# Patient Record
Sex: Male | Born: 1955 | Race: White | Hispanic: No | Marital: Married | Smoking: Current every day smoker
Health system: Southern US, Community
[De-identification: ages and names within clinical notes are randomized; demographics above are authoritative.]

## PROBLEM LIST (undated history)

## (undated) DIAGNOSIS — I739 Peripheral vascular disease, unspecified: Secondary | ICD-10-CM

## (undated) DIAGNOSIS — I2510 Atherosclerotic heart disease of native coronary artery without angina pectoris: Secondary | ICD-10-CM

## (undated) DIAGNOSIS — I219 Acute myocardial infarction, unspecified: Secondary | ICD-10-CM

## (undated) HISTORY — PX: ROTATOR CUFF REPAIR: SHX139

## (undated) HISTORY — DX: Acute myocardial infarction, unspecified: I21.9

## (undated) HISTORY — DX: Peripheral vascular disease, unspecified: I73.9

## (undated) HISTORY — PX: KNEE SURGERY: SHX244

## (undated) HISTORY — DX: Atherosclerotic heart disease of native coronary artery without angina pectoris: I25.10

---

## 1997-11-23 ENCOUNTER — Observation Stay (HOSPITAL_COMMUNITY): Admission: AD | Admit: 1997-11-23 | Discharge: 1997-11-24 | Payer: Self-pay | Admitting: Cardiovascular Disease

## 1998-02-21 ENCOUNTER — Encounter: Payer: Self-pay | Admitting: Emergency Medicine

## 1998-02-21 ENCOUNTER — Inpatient Hospital Stay (HOSPITAL_COMMUNITY): Admission: EM | Admit: 1998-02-21 | Discharge: 1998-02-23 | Payer: Self-pay | Admitting: Emergency Medicine

## 1998-06-01 ENCOUNTER — Inpatient Hospital Stay (HOSPITAL_COMMUNITY): Admission: EM | Admit: 1998-06-01 | Discharge: 1998-06-02 | Payer: Self-pay | Admitting: Emergency Medicine

## 1998-06-01 ENCOUNTER — Encounter: Payer: Self-pay | Admitting: Emergency Medicine

## 1998-06-17 HISTORY — PX: CORONARY ARTERY BYPASS GRAFT: SHX141

## 1998-07-31 ENCOUNTER — Inpatient Hospital Stay (HOSPITAL_COMMUNITY): Admission: EM | Admit: 1998-07-31 | Discharge: 1998-08-02 | Payer: Self-pay | Admitting: Emergency Medicine

## 1998-07-31 ENCOUNTER — Encounter: Payer: Self-pay | Admitting: Emergency Medicine

## 1998-10-09 ENCOUNTER — Encounter: Payer: Self-pay | Admitting: *Deleted

## 1998-10-09 ENCOUNTER — Inpatient Hospital Stay (HOSPITAL_COMMUNITY): Admission: EM | Admit: 1998-10-09 | Discharge: 1998-10-13 | Payer: Self-pay | Admitting: Emergency Medicine

## 1998-10-12 ENCOUNTER — Encounter: Payer: Self-pay | Admitting: Cardiovascular Disease

## 1998-10-28 ENCOUNTER — Encounter: Payer: Self-pay | Admitting: Emergency Medicine

## 1998-10-28 ENCOUNTER — Inpatient Hospital Stay (HOSPITAL_COMMUNITY): Admission: EM | Admit: 1998-10-28 | Discharge: 1998-10-31 | Payer: Self-pay | Admitting: *Deleted

## 1998-11-28 ENCOUNTER — Encounter: Payer: Self-pay | Admitting: Emergency Medicine

## 1998-11-29 ENCOUNTER — Inpatient Hospital Stay (HOSPITAL_COMMUNITY): Admission: EM | Admit: 1998-11-29 | Discharge: 1998-11-30 | Payer: Self-pay | Admitting: Emergency Medicine

## 1998-11-29 ENCOUNTER — Encounter: Payer: Self-pay | Admitting: Cardiovascular Disease

## 1998-12-19 ENCOUNTER — Encounter: Payer: Self-pay | Admitting: Emergency Medicine

## 1998-12-19 ENCOUNTER — Inpatient Hospital Stay (HOSPITAL_COMMUNITY): Admission: EM | Admit: 1998-12-19 | Discharge: 1998-12-30 | Payer: Self-pay | Admitting: Emergency Medicine

## 1998-12-20 ENCOUNTER — Encounter: Payer: Self-pay | Admitting: Cardiovascular Disease

## 1998-12-22 ENCOUNTER — Encounter: Payer: Self-pay | Admitting: Surgery

## 1998-12-25 ENCOUNTER — Encounter: Payer: Self-pay | Admitting: Surgery

## 1998-12-26 ENCOUNTER — Encounter: Payer: Self-pay | Admitting: Surgery

## 1998-12-27 ENCOUNTER — Encounter: Payer: Self-pay | Admitting: Surgery

## 2000-01-21 ENCOUNTER — Inpatient Hospital Stay (HOSPITAL_COMMUNITY): Admission: EM | Admit: 2000-01-21 | Discharge: 2000-01-22 | Payer: Self-pay | Admitting: Emergency Medicine

## 2000-01-21 ENCOUNTER — Encounter: Payer: Self-pay | Admitting: Emergency Medicine

## 2000-01-21 ENCOUNTER — Encounter: Payer: Self-pay | Admitting: *Deleted

## 2002-01-14 ENCOUNTER — Inpatient Hospital Stay (HOSPITAL_COMMUNITY): Admission: EM | Admit: 2002-01-14 | Discharge: 2002-01-15 | Payer: Self-pay | Admitting: Emergency Medicine

## 2002-01-14 ENCOUNTER — Encounter: Payer: Self-pay | Admitting: Emergency Medicine

## 2003-06-28 ENCOUNTER — Inpatient Hospital Stay (HOSPITAL_COMMUNITY): Admission: EM | Admit: 2003-06-28 | Discharge: 2003-06-30 | Payer: Self-pay | Admitting: Emergency Medicine

## 2004-12-26 ENCOUNTER — Ambulatory Visit: Payer: Self-pay | Admitting: Internal Medicine

## 2004-12-26 ENCOUNTER — Inpatient Hospital Stay (HOSPITAL_COMMUNITY): Admission: EM | Admit: 2004-12-26 | Discharge: 2004-12-27 | Payer: Self-pay | Admitting: Emergency Medicine

## 2005-01-01 ENCOUNTER — Encounter: Admission: RE | Admit: 2005-01-01 | Discharge: 2005-01-01 | Payer: Self-pay | Admitting: Occupational Medicine

## 2008-07-08 ENCOUNTER — Encounter (INDEPENDENT_AMBULATORY_CARE_PROVIDER_SITE_OTHER): Payer: Self-pay | Admitting: Internal Medicine

## 2008-07-08 ENCOUNTER — Observation Stay (HOSPITAL_COMMUNITY): Admission: EM | Admit: 2008-07-08 | Discharge: 2008-07-09 | Payer: Self-pay | Admitting: Emergency Medicine

## 2008-07-08 ENCOUNTER — Ambulatory Visit: Payer: Self-pay | Admitting: Vascular Surgery

## 2010-01-05 ENCOUNTER — Ambulatory Visit: Payer: Self-pay | Admitting: Vascular Surgery

## 2010-01-23 ENCOUNTER — Ambulatory Visit (HOSPITAL_COMMUNITY): Admission: RE | Admit: 2010-01-23 | Discharge: 2010-01-23 | Payer: Self-pay | Admitting: Surgery

## 2010-01-23 ENCOUNTER — Ambulatory Visit: Payer: Self-pay | Admitting: Vascular Surgery

## 2010-02-06 ENCOUNTER — Ambulatory Visit: Payer: Self-pay | Admitting: Vascular Surgery

## 2010-02-16 ENCOUNTER — Inpatient Hospital Stay (HOSPITAL_COMMUNITY): Admission: RE | Admit: 2010-02-16 | Discharge: 2010-02-22 | Payer: Self-pay | Admitting: Vascular Surgery

## 2010-02-16 ENCOUNTER — Ambulatory Visit: Payer: Self-pay | Admitting: Vascular Surgery

## 2010-03-20 ENCOUNTER — Ambulatory Visit: Payer: Self-pay | Admitting: Vascular Surgery

## 2010-04-10 ENCOUNTER — Ambulatory Visit: Payer: Self-pay | Admitting: Vascular Surgery

## 2010-07-07 ENCOUNTER — Encounter: Payer: Self-pay | Admitting: Vascular Surgery

## 2010-07-08 ENCOUNTER — Encounter: Payer: Self-pay | Admitting: Surgery

## 2010-07-08 ENCOUNTER — Encounter: Payer: Self-pay | Admitting: Internal Medicine

## 2010-08-30 LAB — SURGICAL PCR SCREEN
MRSA, PCR: NEGATIVE
Staphylococcus aureus: NEGATIVE

## 2010-08-30 LAB — COMPREHENSIVE METABOLIC PANEL WITH GFR
ALT: 16 U/L (ref 0–53)
AST: 25 U/L (ref 0–37)
Albumin: 2.7 g/dL — ABNORMAL LOW (ref 3.5–5.2)
Alkaline Phosphatase: 52 U/L (ref 39–117)
BUN: 6 mg/dL (ref 6–23)
CO2: 27 meq/L (ref 19–32)
Calcium: 7.6 mg/dL — ABNORMAL LOW (ref 8.4–10.5)
Chloride: 104 meq/L (ref 96–112)
Creatinine, Ser: 0.89 mg/dL (ref 0.4–1.5)
GFR calc non Af Amer: >60 mL/min
Glucose, Bld: 121 mg/dL — ABNORMAL HIGH (ref 70–99)
Potassium: 3.9 meq/L (ref 3.5–5.1)
Sodium: 135 meq/L (ref 135–145)
Total Bilirubin: 0.7 mg/dL (ref 0.3–1.2)
Total Protein: 5.7 g/dL — ABNORMAL LOW (ref 6.0–8.3)

## 2010-08-30 LAB — URINALYSIS, ROUTINE W REFLEX MICROSCOPIC
Hgb urine dipstick: NEGATIVE
Nitrite: NEGATIVE
Protein, ur: NEGATIVE mg/dL
Specific Gravity, Urine: 1.012 (ref 1.005–1.030)
Urobilinogen, UA: 0.2 mg/dL (ref 0.0–1.0)

## 2010-08-30 LAB — CBC
HCT: 38.2 % — ABNORMAL LOW (ref 39.0–52.0)
Hemoglobin: 11.7 g/dL — ABNORMAL LOW (ref 13.0–17.0)
Hemoglobin: 12.2 g/dL — ABNORMAL LOW (ref 13.0–17.0)
Hemoglobin: 12.7 g/dL — ABNORMAL LOW (ref 13.0–17.0)
Hemoglobin: 14.3 g/dL (ref 13.0–17.0)
MCH: 30.2 pg (ref 26.0–34.0)
MCH: 30.4 pg (ref 26.0–34.0)
MCH: 30.4 pg (ref 26.0–34.0)
MCH: 30.6 pg (ref 26.0–34.0)
MCHC: 33.2 g/dL (ref 30.0–36.0)
MCHC: 33.5 g/dL (ref 30.0–36.0)
MCHC: 33.6 g/dL (ref 30.0–36.0)
MCHC: 34.7 g/dL (ref 30.0–36.0)
MCV: 90.5 fL (ref 78.0–100.0)
MCV: 91 fL (ref 78.0–100.0)
Platelets: 172 10*3/uL (ref 150–400)
Platelets: 190 K/uL (ref 150–400)
Platelets: 244 10*3/uL (ref 150–400)
RBC: 3.85 MIL/uL — ABNORMAL LOW (ref 4.22–5.81)
RBC: 4.02 MIL/uL — ABNORMAL LOW (ref 4.22–5.81)
RBC: 4.2 MIL/uL — ABNORMAL LOW (ref 4.22–5.81)
RBC: 4.67 MIL/uL (ref 4.22–5.81)
RDW: 13.7 % (ref 11.5–15.5)
RDW: 13.9 % (ref 11.5–15.5)
WBC: 16.6 K/uL — ABNORMAL HIGH (ref 4.0–10.5)

## 2010-08-30 LAB — BLOOD GAS, ARTERIAL
Bicarbonate: 23.6 mEq/L (ref 20.0–24.0)
Drawn by: 333511
FIO2: 0.21 %
O2 Saturation: 91 %
pCO2 arterial: 36.7 mmHg (ref 35.0–45.0)
pH, Arterial: 7.423 (ref 7.350–7.450)
pO2, Arterial: 56.9 mmHg — ABNORMAL LOW (ref 80.0–100.0)

## 2010-08-30 LAB — BASIC METABOLIC PANEL
CO2: 26 mEq/L (ref 19–32)
Calcium: 8.2 mg/dL — ABNORMAL LOW (ref 8.4–10.5)
Creatinine, Ser: 0.73 mg/dL (ref 0.4–1.5)
Creatinine, Ser: 0.78 mg/dL (ref 0.4–1.5)
GFR calc Af Amer: >60 mL/min (ref >60)
GFR calc Af Amer: >60 mL/min (ref >60)
GFR calc non Af Amer: >60 mL/min (ref >60)
Potassium: 3.7 mEq/L (ref 3.5–5.1)
Sodium: 136 mEq/L (ref 135–145)

## 2010-08-30 LAB — TYPE AND SCREEN: ABO/RH(D): A POS

## 2010-08-30 LAB — COMPREHENSIVE METABOLIC PANEL
ALT: 22 U/L (ref 0–53)
AST: 25 U/L (ref 0–37)
Albumin: 3.8 g/dL (ref 3.5–5.2)
Calcium: 9.1 mg/dL (ref 8.4–10.5)
Creatinine, Ser: 0.78 mg/dL (ref 0.4–1.5)
GFR calc Af Amer: >60 mL/min (ref >60)
Sodium: 137 mEq/L (ref 135–145)

## 2010-08-30 LAB — GLUCOSE, CAPILLARY
Glucose-Capillary: 106 mg/dL — ABNORMAL HIGH (ref 70–99)
Glucose-Capillary: 118 mg/dL — ABNORMAL HIGH (ref 70–99)

## 2010-08-30 LAB — PROTIME-INR
INR: 1.15 (ref 0.00–1.49)
Prothrombin Time: 14.9 s (ref 11.6–15.2)

## 2010-08-30 LAB — BASIC METABOLIC PANEL WITH GFR
BUN: 10 mg/dL (ref 6–23)
CO2: 23 meq/L (ref 19–32)
Calcium: 7.9 mg/dL — ABNORMAL LOW (ref 8.4–10.5)
Chloride: 110 meq/L (ref 96–112)
Creatinine, Ser: 0.89 mg/dL (ref 0.4–1.5)
GFR calc non Af Amer: >60 mL/min
Glucose, Bld: 103 mg/dL — ABNORMAL HIGH (ref 70–99)
Potassium: 4.2 meq/L (ref 3.5–5.1)
Sodium: 138 meq/L (ref 135–145)

## 2010-08-30 LAB — POCT I-STAT, CHEM 8
BUN: 11 mg/dL (ref 6–23)
Calcium, Ion: 1.12 mmol/L (ref 1.12–1.32)
Chloride: 110 meq/L (ref 96–112)
Creatinine, Ser: 0.8 mg/dL (ref 0.4–1.5)
Glucose, Bld: 100 mg/dL — ABNORMAL HIGH (ref 70–99)
HCT: 37 % — ABNORMAL LOW (ref 39.0–52.0)
Hemoglobin: 12.6 g/dL — ABNORMAL LOW (ref 13.0–17.0)
Potassium: 4.5 meq/L (ref 3.5–5.1)
Sodium: 141 meq/L (ref 135–145)
TCO2: 24 mmol/L (ref 0–100)

## 2010-08-30 LAB — APTT
aPTT: 32 s (ref 24–37)
aPTT: 29 seconds (ref 24–37)

## 2010-08-30 LAB — MAGNESIUM: Magnesium: 1.9 mg/dL (ref 1.5–2.5)

## 2010-08-30 LAB — AMYLASE: Amylase: 143 U/L — ABNORMAL HIGH (ref 0–105)

## 2010-08-31 LAB — POCT I-STAT, CHEM 8
Calcium, Ion: 1.09 mmol/L — ABNORMAL LOW (ref 1.12–1.32)
Chloride: 112 mEq/L (ref 96–112)
Creatinine, Ser: 0.9 mg/dL (ref 0.4–1.5)
Glucose, Bld: 93 mg/dL (ref 70–99)
Hemoglobin: 13.9 g/dL (ref 13.0–17.0)
Potassium: 3.9 mEq/L (ref 3.5–5.1)

## 2010-10-01 LAB — BASIC METABOLIC PANEL
BUN: 8 mg/dL (ref 6–23)
CO2: 23 mEq/L (ref 19–32)
Calcium: 8.8 mg/dL (ref 8.4–10.5)
Chloride: 105 mEq/L (ref 96–112)
Creatinine, Ser: 1.09 mg/dL (ref 0.4–1.5)
GFR calc Af Amer: >60 mL/min (ref >60)
GFR calc non Af Amer: >60 mL/min (ref >60)
Glucose, Bld: 109 mg/dL — ABNORMAL HIGH (ref 70–99)
Potassium: 3.5 mEq/L (ref 3.5–5.1)
Sodium: 136 mEq/L (ref 135–145)

## 2010-10-01 LAB — CBC
HCT: 45.9 % (ref 39.0–52.0)
Hemoglobin: 15.4 g/dL (ref 13.0–17.0)
MCHC: 33.6 g/dL (ref 30.0–36.0)
MCV: 90.3 fL (ref 78.0–100.0)
Platelets: 314 10*3/uL (ref 150–400)
RBC: 5.08 MIL/uL (ref 4.22–5.81)
RDW: 13.9 % (ref 11.5–15.5)
WBC: 11.5 10*3/uL — ABNORMAL HIGH (ref 4.0–10.5)

## 2010-10-01 LAB — GLUCOSE, CAPILLARY: Glucose-Capillary: 93 mg/dL (ref 70–99)

## 2010-10-01 LAB — LIPID PANEL
Cholesterol: 173 mg/dL (ref 0–200)
Total CHOL/HDL Ratio: 7.2 RATIO

## 2010-10-01 LAB — POCT CARDIAC MARKERS
CKMB, poc: <1 ng/mL — ABNORMAL LOW (ref 1.0–8.0)
Myoglobin, poc: 50.4 ng/mL (ref 12–200)
Troponin i, poc: <0.05 ng/mL (ref 0.00–0.09)

## 2010-10-01 LAB — CARDIAC PANEL(CRET KIN+CKTOT+MB+TROPI)
CK, MB: 1.2 ng/mL (ref 0.3–4.0)
Relative Index: INVALID (ref 0.0–2.5)
Total CK: 72 U/L (ref 7–232)
Total CK: 78 U/L (ref 7–232)

## 2010-10-01 LAB — DIFFERENTIAL
Basophils Absolute: 0.7 10*3/uL — ABNORMAL HIGH (ref 0.0–0.1)
Basophils Relative: 6 % — ABNORMAL HIGH (ref 0–1)
Eosinophils Absolute: 0.6 10*3/uL (ref 0.0–0.7)
Eosinophils Relative: 5 % (ref 0–5)
Lymphocytes Relative: 50 % — ABNORMAL HIGH (ref 12–46)
Lymphs Abs: 5.7 10*3/uL — ABNORMAL HIGH (ref 0.7–4.0)
Monocytes Absolute: 0.9 10*3/uL (ref 0.1–1.0)
Monocytes Relative: 8 % (ref 3–12)
Neutro Abs: 3.6 10*3/uL (ref 1.7–7.7)
Neutrophils Relative %: 31 % — ABNORMAL LOW (ref 43–77)

## 2010-10-01 LAB — PROTIME-INR
INR: 1 (ref 0.00–1.49)
Prothrombin Time: 13.8 seconds (ref 11.6–15.2)

## 2010-10-01 LAB — CK TOTAL AND CKMB (NOT AT ARMC)
CK, MB: 1.6 ng/mL (ref 0.3–4.0)
Relative Index: INVALID (ref 0.0–2.5)
Total CK: 75 U/L (ref 7–232)

## 2010-10-01 LAB — TROPONIN I: Troponin I: <0.01 ng/mL (ref 0.00–0.06)

## 2010-10-30 NOTE — Consult Note (Signed)
NEW PATIENT CONSULTATION   READ, BONELLI  DOB:  [birth year]                                       01/05/2010  CHART#:[id number]   The patient presents today for evaluation of bilateral lower extremity  arterial insufficiency.  I had seen him approximately 5-6 years ago at  which time he was having right leg claudication symptoms.  He has a very  premature history of atherosclerotic disease, having undergone coronary  bypass grafting in 2000.  He was 55 years old at the time.  On my last  visit with him, I had discussed arteriography for further evaluation due  to his severe claudication.  He elected to live with the level of  claudication at that time.  He also had difficulty with no insurance.  He presents now with worsening claudication, and this is equal in his  left and his right leg.  He reports that he is able to walk very little  distance before he has intolerable claudication symptoms.  He does not  have any history of tissue loss.  The discomfort is mainly in his calves  but can extend up to his thighs if he continues to walk.  He does  occasionally have some numbness in his legs with prolonged standing.  He  has some back soreness but no history of degenerative disk disease.   PAST MEDICAL HISTORY:  Significant for multiple coronary stenting prior  to his coronary artery bypass grafting.  He reports that he had recently  undergone cardiac stress testing with Dr. Peter Swaziland and apparently  does not have any evidence of reversible ischemia and only has evidence  of his old myocardial infarction.  He does have a history of emphysema,  benign prostatic hypertrophy and hyperlipidemia.   SOCIAL HISTORY:  He is married with 2 children.  He continues to smoke 1  pack of cigarettes per day and drinks approximately 12 beers per week.   FAMILY HISTORY:  Significant for stroke in his mother and father and  premature cardiac disease in his father.   REVIEW OF  SYSTEMS:  GENERAL:  No weight loss or weight gain.  VASCULAR:  He does have pain in his legs and feet with walking.  CARDIAC:  Does have shortness breath with exertion and lying flat.  GI:  Does have esophageal reflux.  NEUROLOGIC:  Does have dizziness and headaches.  PULMONARY:  Does have wheezing.  HEMATOLOGIC:  Negative.  URINARY:  Positive for urinary frequency and difficulty urinating.  ENT:  Negative.  MUSCULOSKELETAL:  Negative.  PSYCHIATRIC:  Negative.  SKIN:  Negative.   PHYSICAL EXAMINATION:  Well-developed, well-nourished white male  appearing stated age of 55.  Blood pressure is 132/93, pulse 80,  respirations 18, and his oxygen saturations are 99% on room air.  He is  in no acute distress.  HEENT:  Normal, except for poor dentition.  Lungs:  Clear bilaterally with no rales, rhonchi or wheezes.  Heart:  Regular rate and rhythm without murmur.  He does have a 2+ radial.  He  has faint right and 1+ left femoral pulse.  He has absent popliteal and  distal pulses bilaterally.  Abdomen:  Nontender.  No palpable masses.  No bruits.  Musculoskeletal:  No major deformities or cyanosis.  Neurologic:  No focal weakness or paresthesias.  Skin:  Without ulcers  or rashes.   I ordered and independently interpreted his noninvasive vascular study  today.  This shows ankle index of 0.71 on the right and 0.66 on the  left.  He has monophasic waveforms bilaterally.  I had a long discussion  with the patient and his wife present.  I explained the significance of  his arterial insufficiency and his limited claudication.  He reports  that he is unable to tolerate this level of discomfort and therefore is  willing to proceed with arteriography.  This is scheduled for Dr. Durene Cal on 01/23/2010 at [facility].  He understands that if he  does have reasonable lesion for stenting, this would be accomplished at  the same setting, but if not this would be diagnostic study to  determine  surgical treatment options.  We will discuss this further following his  arteriogram.     Larina Earthly, M.D.  Electronically Signed   TFE/MEDQ  D:  01/05/2010  T:  01/08/2010  Job:  [id number]   cc:   Maryelizabeth Rowan, M.D.  Peter M. Swaziland, M.D.

## 2010-10-30 NOTE — H&P (Signed)
Andre Pena, LAMPHIER              ACCOUNT NO.:  [id number]   MEDICAL RECORD NO.:  [id number]          PATIENT TYPE:  OBV   LOCATION:  4733                         FACILITY:  [facility]   PHYSICIAN:  Herbie Saxon, MDDATE OF BIRTH:  12/10/1955   DATE OF ADMISSION:  07/07/2008  DATE OF DISCHARGE:                              HISTORY & PHYSICAL   PRIMARY CARE PHYSICIAN:  Unassigned.   CARDIOLOGIST:  Nanetta Batty, M.D.   He is a full code.   PRESENTING COMPLAINT:  Passing out episode at 8 p.m. today.   HISTORY OF PRESENTING COMPLAINT:  This is a 55 year old Caucasian male  who had coronary artery disease status post CABG in 1999, who has not  seen any cardiologist in the last 6 years and does not follow up with  any primary care physician, has been feeling weak and dizzy earlier in  the day with double vision and nagging headache and dry cough.  However,  he was found passed out in the shop about 2 minutes by his family  member.  There was no documented urine or fecal incontinence, no facial  droop, no slurred speech, no arm drift, no tongue biting, and did not  sustain any head injury.  This is the first time he is having any  syncopal episode.  He denies any chest pain or palpitation prior to  this.  No paroxysmal nocturnal dyspnea, orthopnea, or pedal swelling.  There is no new skin rash, fever, or diarrhea.  He denies melena stool,  hematemesis, or nausea.  The patient has not been having any dyspnea on  mild exertion.  He continues to smoke 1 pack per day, he has been doing  this for more than 20 years, despite his history of coronary artery  disease.  He rarely drinks alcohol.  The patient denies any convulsions  or diaphoresis.   PAST MEDICAL HISTORY:  1. Coronary artery disease.  2. Peripheral vascular disease.   PAST SURGICAL HISTORY:  Coronary artery bypass graft.   SOCIAL HISTORY:  He denies history of drugs, continues to smoke 1 pack  per day more than 40  years and drink socially.   FAMILY HISTORY:  Both parents had CVA and heart disease.   REVIEW OF SYSTEMS:  A 14-point review of systems was performed with him,  pertinent positives as stated in the history of presenting complaints.   ALLERGIES:  NITROGLYCERIN and PERCODAN.   MEDICATIONS:  None.   PHYSICAL EXAMINATION:  GENERAL:  He is a middle-aged man, not in acute  respiratory distress.  VITAL SIGNS:  Temperature is 97.3, pulse 79, respiratory rate 20, and  blood pressure 122/83.  HEENT:  His head is atraumatic and normocephalic.  Mucous membranes are  moist.  ENT is clear.  NECK:  Supple.  No submandibular lymphadenopathy or elevated JVD.  No  carotid bruit.  PMI is in the fifth intercostal space, midclavicular line, parasternal  heave, no thrills, gallops, rubs, or murmurs.  HEART:  Heart sounds 1 and 2, regular.  CHEST:  Clear.  No rales or rhonchi.  ABDOMEN:  Soft and nontender.  No organomegaly.  Bowel sounds are  normoactive.  Inguinal orifices are patent.  CNS:  No deficits.  He is alert and oriented x3.  Mood is stable.  Peripheral pulses are present.  No pedal edema.   The labs are mostly pending, but WBC is 11.5, hematocrit 45, platelet  count is 314,000.  Troponin less than 0.05.  Chemistry, sodium is 136,  potassium 3.5, chloride 105, bicarbonate 23, BUN is 8, creatinine 1.0.   ASSESSMENT:  1. Syncopal episode, rule out cardiac arrhythmia.  2. History of coronary artery disease, status post coronary artery      bypass graft surgery.  3. Poor medical compliance.  4. Leukocytosis, likely reactive.  5. Peripheral vascular disease.  6. Tobacco abuse.  7. Alcohol use.   PLAN:  The patient will be admitted to 24-hour observation to telemetry.  We will obtain his 2-D echo, carotid Doppler, and CT brain, cycle his  cardiac enzymes and EKG q.8 h. x3, obtain his fasting lipids, TSH and  homocysteine level.  He should be on IV saline lock.  Diet, heart  healthy, low  cholesterol.  O2, 2 L nasal cannula p.r.n.  He is to be on  bed rest for the first 24 hours with seizure and fall precautions.  Lovenox 40 mg subcu daily, Phenergan IV q.8 h. p.r.n., Robitussin DM 5  mL p.o. q.4 h. p.r.n. for dry cough, Tylenol 650 mg q.4-6 h. p.r.n. for  mild-to-moderate pain or fever.  Check his labs in the morning and  consider Neurology and Cardiology evaluation as indicated.  The  patient's illness, medication and treatment plan explained to him and  his family.  They verbalized understanding.  Counseled on tobacco  cessation.  Put him on nicotine patch 21 mg per day, Xanax 0.25 mg  b.i.d.      Herbie Saxon, MD  Electronically Signed     MIO/MEDQ  D:  07/08/2008  T:  07/09/2008  Job:  [id number]

## 2010-10-30 NOTE — Procedures (Signed)
EEG NUMBER:  [id number]   CLINICAL HISTORY:  A 55 year old patient being evaluated for syncope.   MEDICATION LISTED:  Xanax, aspirin, Lovenox, Nicoderm, Protonix,  Tylenol, and Phenergan.   This is a 17-channel portable EEG recorded with the patient awake and  drowsy using standard 10/20 electrode placement.   Background awake rhythm consists of 9-10 Hz alpha which is of excellent  amplitude synchronous reactive to eye opening and closure.  No  paroxysmal epileptiform activity, spikes, or sharp waves are noted.  Length of the recording is 20.5 minutes.  Technical component is  average.  EKG tracing reveals regular sinus rhythm.  Hyperventilation  and photic stimulation not performed using a portable study.   IMPRESSION:  This EEG performed during awake and drowsy states, is  within normal limits.  No definite epileptiform features were noted.           ______________________________  Sunny Schlein. Pearlean Brownie, MD     NFA:OZHY  D:  07/08/2008 18:21:43  T:  07/09/2008 10:03:26  Job #:  [id number]   cc:   Beckey Rutter, MD

## 2010-10-30 NOTE — Discharge Summary (Signed)
Andre Pena, Andre Pena              ACCOUNT NO.:  [id number]   MEDICAL RECORD NO.:  [id number]          PATIENT TYPE:  OBV   LOCATION:  4733                         FACILITY:  [facility]   PHYSICIAN:  Beckey Rutter, MD  DATE OF BIRTH:  Jun 05, 1956   DATE OF ADMISSION:  07/07/2008  DATE OF DISCHARGE:  07/09/2008                               DISCHARGE SUMMARY   PRIMARY CARE PHYSICIAN:  Unassigned.   PRIMARY CARDIOLOGIST:  Nanetta Batty, MD, [facility].   CHIEF COMPLAINT:  Syncope.   HOSPITAL COURSE:  During hospital course, the patient was investigated  for syncope.  The result is as follows:  His cardiac panel was showing  troponin less than 0.01, TSH is 2.152, homocysteine is 10.4, lipid  profile showing cholesterol is 73, HDL is 24, and LDL is 120.   On July 08, 2008, the patient had a portable chest x-ray, impression  is showing status post CABG and mild bibasilar atelectasis.   MRI and MRA done on July 08, 2008, the impression for MRI is no acute  infarction.  For the MRA, the impression is slightly limited motion  degraded exam without medium or wall large size vessel, significant  stenosis or occlusion.  PICA is not included on the present exam.   The patient had 2-D echo on July 08, 2008, the summary is:  1. Overall left ventricular systolic function was normal.  Left      ventricular ejection fraction was estimated to be 55%.  There was      no diagnostic evidence of left ventricular regional wall motion      abnormalities.  2. Aortic valve thickness was mildly increased.  3. There was mild focal atheroma of the aortic arch just beyond the      great vessels.   EEG done on July 08, 2008, the impression is the EEG performed during  awake and drowsy states is within normal limits.  No definite  epileptiform feature was noted.   HOSPITAL COURSE:  1. As mentioned above, the patient was admitted for syncopal episode.      All the workup was unyielding.  The  telemetry monitor was negative.      The carotid duplex was essentially negative for ICA stenosis.  The      2-D echo was not showing any significant reason, could explain the      patient syncope/passing out.  The MRI also is essentially negative      as discussed above.  I discussed all the results with the patient      thoroughly and the patient will be discharged today to follow up      with [facility] for primary care and possible further      assessment for his symptoms.  The patient and the family are aware      and agreeable to the plan.  Note, the patient mentioned that he was      drinking alcohol when he had the episode.  I suspect the patient      might have alcohol intoxication as an explanation for him passing  out since all the workup is essentially negative.   1. Alcohol abuse.  The patient is counseled to quit smoking.  2. Coronary artery disease, status post CABG.  The patient was advised      to follow up with his primary cardiologist, Dr. Allyson Sabal.  The patient      currently is not on any medication.  I will start the patient on      aspirin baby dose.   DISCHARGE DIAGNOSIS:  1. Syncope, likely related to alcohol consumption, workup is negative.  2. Coronary artery disease, status post coronary artery bypass      grafting.  3. Peripheral vascular disease.  4. Tobacco abuse.  5. Alcohol binging.   DISCHARGE MEDICATIONS:  Enteric-coated aspirin 81 mg daily.   The patient was advised to follow up with the primary health care  physician and our [facility], phone number was provided to him  on discharge with discharge instruction.  He was also advised to follow  up with his cardiologist, Dr. Allyson Sabal and he is aware and agreeable to  discharge plan as discussed above.      Beckey Rutter, MD  Electronically Signed     EME/MEDQ  D:  07/09/2008  T:  07/09/2008  Job:  [id number]

## 2010-10-30 NOTE — Assessment & Plan Note (Signed)
OFFICE VISIT   Pena, Andre  DOB:  1955-11-13                                       02/06/2010  CHART#:[id number]   The patient presents today for continued discussion of his severe lower  extremity arterial insufficiency.  Since my last visit he had an  attempted arteriogram and was found to have occlusion of his iliac  system and was not able to have complete arteriogram.  This was on  01/23/2010.  He subsequently underwent CT angiogram for further  evaluation.  This was on 08/09 and I have this for review.  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  He did have  reconstitution of his external iliac and femoral arteries and  fortunately he does have patency of his superficial femoral arteries.  He has severe limiting claudication and is unable to do essentially  minimal walking without severe pain.  He reports this interrupts his  usual activity.  He continues to deny any cardiac issues and has seen  Dr. Swaziland for cardiac clearance.  He unfortunately continues to smoke  cigarettes.   Review of systems otherwise unchanged as is his medical history.   PHYSICAL EXAMINATION:  He has absent femoral pulses bilaterally.  Blood  pressure today is 141/78, pulse 91, respirations 18.  HEENT:  Normal  aside from poor dentition.  Abdomen:  Exam is benign.  He has no masses  and no tenderness.  Skin:  Is without ulcers or rashes.  Neurological:  No focal weakness or paresthesias.   I again reviewed his CT scan with the patient and his wife and  recommended aortobifemoral bypass grafting for relief of his severe  claudication symptoms.  He wishes to proceed with this as soon as  possible and we have scheduled this at his convenience on 09/02 at [facility].     Larina Earthly, M.D.  Electronically Signed   TFE/MEDQ  D:  02/06/2010  T:  02/07/2010  Job:  [id number]   cc:   Peter M. [place], M.D.  Maryelizabeth Rowan, M.D.

## 2010-10-30 NOTE — Assessment & Plan Note (Signed)
OFFICE VISIT   KINNETH, FUJIWARA E  DOB:  [birth year]                                       04/10/2010  BJYNW#:[id number]   Lorin Glass presents for follow-up of his aortobifemoral bypass for  aortoiliac occlusive disease on 02/17/19 11.  He continues to do  extremely well.  He was seen on October 4 where he had some the Vicryl  subcuticular stitch irritation on both groins.  These were debrided and  he was placed on Keflex.  These have now completely resolved.  He has  had no fevers and no evidence of drainage.  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  He reports that he is now walking  rather than riding the cart and is doing no claudication.   On physical exam, his femoral pulses are 2+ and he has palpable pedal  pulses bilaterally.  I again stressed critical importance of smoking  cessation from a standpoint of his overall health and peripheral  vascular occlusive disease and cardiac disease.  He understands and has  continued to attempt to quit this.  He will see [place] again on an as-needed  basis.     Larina Earthly, M.D.  Electronically Signed   TFE/MEDQ  D:  04/10/2010  T:  04/11/2010  Job:  [id number]   cc:   Peter M. [place], M.D.

## 2010-10-30 NOTE — Assessment & Plan Note (Signed)
OFFICE VISIT   GEO, SLONE E  DOB:  01/25/1956                                       03/20/2010  ZOXWR#:[id number]   Patient presents today for follow-up of his aortobifemoral bypass for  occlusive disease on 02/16/10.  He has done well since surgery.  He did  have some confusion, most likely related to narcotics during his  admission to the hospital, and this is completely cleared.  He is  thrilled with his walking ability and reports this has completely  resolved.  He does have concern regarding his groin incisions.   His abdominal incision is completely healed with no evidence of hernia  or erythema.  He does have reaction to the Vicryl subcuticular stitches  in both groins.  This was debrided in both groins today, and this does  appear to be superficial with a superficial stitch abscess rather than  any deep infection.   I explained to him the need for continued good, cleaning of this, and  the concern regarding potential graft infection.  He is started today on  Keflex 500 mg t.i.d. for 10 days.  I will see him again in 3 weeks for  continued follow-up.   He underwent noninvasive vascular laboratory studies today in our  office, and this reveals normal ankle-arm index bilaterally.     Larina Earthly, M.D.  Electronically Signed   TFE/MEDQ  D:  03/20/2010  T:  03/21/2010  Job:  [id number]   cc:   Maryelizabeth Rowan, M.D.

## 2010-11-02 NOTE — Discharge Summary (Signed)
[facility]  Patient:    Andre Pena, Andre Pena Visit Number: [id number] MRN: [id number]          Service Type: MED Location: MICU 2110 01 Attending Physician:  Darlin Priestly Dictated by:   Marya Fossa, P.A. Admit Date:  01/21/2000 Disc. Date: 01/22/00   CC:         [name], M.D.             Armanda Magic, M.D.             Verdie Drown, Montez Hageman., M.D.                           Discharge Summary  DATE OF BIRTH:  01-08-1956  [facility] Medical Record Number [id number]  ADMISSION DIAGNOSES: 1. Chest pain, rule out myocardial infarction. 2. Hyperlipidemia. 3. Ongoing tobacco and alcohol abuse. 4. Relative hypotension, stable. 5. Gastroesophageal reflux disease.  DISCHARGE DIAGNOSES: 1. Chest pain, noncardiac.  Myocardial infarction ruled out with negative    enzymes.  Cardiac catheterization revealed stable coronary artery disease. 2. Hyperlipidemia. 3. Ongoing tobacco and alcohol abuse. 4. Relative hypotension, stable. 5. Gastroesophageal reflux disease.  HISTORY OF PRESENT ILLNESS:  Mr. Pleas Koch is a 55 year old white male patient of Dr. Mayford Knife and Dr. Tresa Endo with known CAD status post multiple cardiac catheterizations with interventions (approximately 12 to 13).  He ultimately underwent coronary artery bypass grafting x 2 vessels in July 2000 with LIMA to the OM and SVG to the PDA.  He still smokes at least a pack a day.  He drinks greater than six beers a week.  Last evening while watching television, he developed insidious onset of substernal chest pressure ultimately reaching a 10/10 on the pain scale with nausea, vomiting, shortness of breath, and diaphoresis.  He did not take nitroglycerin but called EMS who then gave him 2 nitroglycerin and aspirin without relief.  He as brought to [facility] and given IV morphine with relief.  He is now on IV nitroglycerin at 1 drop and IV heparin with 2/10 chest  pressure.  He is stable.  EKG shows sinus rhythm without acute abnormality.  The patient will be admitted for chest pain, rule out MI.  Will plan cardiac catheterization.  PROCEDURES:  Cardiac catheterization January 21, 2000, by Dr. Lenise Herald.  COMPLICATIONS:  None.  CONSULTATIONS:  None.  COURSE IN HOSPITAL:  The patient was admitted on IV nitroglycerin and IV heparin.  Cardiac enzymes were negative.  CBC revealed a white blood cell count of 10.8, hemoglobin 14.0, platelets 348.  BMP showed a sodium of 140, potassium 3.9, BUN 8, creatinine 1.0.  LFTs within normal limits.  A lipid profile was drawn, but the results were pending at the time of this dictation.  The patient was taken to the cardiac catheterization lab in the afternoon on January 21, 2000, by Dr. Jenne Campus.  This revealed a normal left main, normal LAD, circumflex 100% occluded in its mid portion, LIMA to the OM patent, diffuse high-grade disease of the native right coronary artery with patent SVG to the PDA.  LV EF 40 to 45% with anterior lateral hypokinesis.  No MR.  Dr. Jenne Campus felt that the patients chest pain was noncardiac and may be related to his GERD for which he had stopped his proton pump inhibitor therapy on his own.  We will restart this at this time.  The patient tolerated  the procedure well, and there were no problems with the groin after sheath pull.  The patient was counselled on smoking cessation, and we have restarted the Wellbutrin which he was on before.  We also counselled him on cutting back on his alcohol intake.  DISCHARGE MEDICATIONS: 1. Protonix 40 mg q.d. 2. Lipitor 20 mg q.h.s. 3. Altace 2.5 mg. 4. Wellbutrin 150 mg b.i.d. 5. Aspirin 325 mg q.d. 6. Nitroglycerin as needed for chest pain.  ACTIVITY:  The patient should perform no strenuous activity or lifting more than 5 pounds or sexual activity for two days.  He may return to work Monday, January 28, 2000 without  restriction.  DIET:  He should follow a low-fat, low-cholesterol, low-salt diet.  WOUND CARE:  He may shower.  He should not soak in the tub or swim for a week.  FOLLOWUP:  He is to call us for any problems or questions.  Followup appointment has been scheduled with [facility] A. Delanna Ahmadi, R.N., N.P. on August 27 at 10:20. Dictated by:   Marya Fossa, P.A. Attending Physician:  Darlin Priestly DD:  01/22/00 TD:  01/23/00 Job: [id number]

## 2010-11-02 NOTE — H&P (Signed)
NAME:  Andre Pena, Andre Pena                          ACCOUNT NO.:  [id number]   MEDICAL RECORD NO.:  [id number]                   PATIENT TYPE:  INP   LOCATION:  1823                                 FACILITY:  [facility]   PHYSICIAN:  Corinna L. Lendell Caprice, MD             DATE OF BIRTH:  August 31, 1955   DATE OF ADMISSION:  06/28/2003  DATE OF DISCHARGE:                                HISTORY & PHYSICAL   CHIEF COMPLAINT:  Chest pain.   HISTORY OF PRESENT ILLNESS:  Andre Pena is a 55 year old white male with a  history of coronary artery disease, continued smoking and medical  noncompliance who presents to the emergency room with chest pain and  heaviness in the substernal area which began at 11 o'clock last night.  He  was shopping at [employer].  He has felt a lot of chest pain recently.  This  feels like his previous episode of angina and his previous myocardial  infarction.  He refused nitroglycerin because it has dropped his pressure  tremendously in the past.  He currently feels no chest pain but merely a  soreness.  He also had nausea, vomiting, diaphoresis with this and arrived  via EMS.  He takes an aspirin a day but has not taken any of his medicines.  He has been seen by Dr. Armanda Magic and alternately most recently by Dr.  Cristy Hilts. [place] in August of 2003.  At that time, he also had a cardiac  catheterization by Lenise Herald.   PAST MEDICAL HISTORY:  1. Coronary artery disease with MI x2 in 2000, status post coronary bypass     graft and stent placement, ejection fraction of 40 to 45% on last     catheterization in August 2003.  Last catheterization showed a     significant two-vessel coronary artery disease and patent left internal     mammary artery to the second obtuse marginal, patent vein graft to the     posterior descending coronary artery.  2. He is currently being worked up for claudication and has not had arterial     Dopplers but on catheterization in 2003, he had no evidence  of renal     artery stenosis but 80% right iliac and moderate diffuse disease in the     left iliac.  3. Hyperlipidemia.  4. Gastroesophageal reflux disease.  5. Reported history of asthma.   MEDICATIONS:  Aspirin.   SOCIAL HISTORY:  The patient smokes a pack of cigarettes a day.  He does not  drink or use drugs.  He recently moved back to the area from [place],  [place].   FAMILY HISTORY:  Reviewed and as per previous.   REVIEW OF SYMPTOMS:  CONSTITUTIONAL:  No fever, chills, or weight loss.  HEENT:  He reports periodic diplopia and sometimes facial numbness lasting a  minute to 10 minutes periodically over the past several months.  He  has not  sought medical care for this.  RESPIRATORY:  No cough or shortness of  breath.  CARDIOVASCULAR:  As above. GI:  No history of GI bleed.  GU:  No  dysuria or hematuria.  MUSCULOSKELETAL:  He suffers from leg pain when he  walks.  This gets better when he rests.  He was referred to a vascular  surgeon as an outpatient for this but he has not made the appointment yet.  ENDOCRINE:  No diabetes.  HEMATOLOGIC:  No history of thromboembolism.  NEUROLOGIC:  Please see HEENT.  PSYCHIATRIC:  No depression.   PHYSICAL EXAMINATION:  GENERAL APPEARANCE:  The patient is asleep but  arousable.  VITAL SIGNS:  His blood pressure is 129/78, pulse 86, respiratory rate 12.  HEENT:  Normocephalic and atraumatic.  Pupils are equal, round and reactive  to light. Sclerae nonicteric.  Moist mucous membranes.  NECK:  Supple. No carotid bruits.  No thyromegaly.  No lymphadenopathy.  LUNGS:  Clear to auscultation bilaterally without wheezing, rhonchi or  rales.  CARDIOVASCULAR:  Regular rate and rhythm without murmurs, rubs, or gallops.  ABDOMEN:  Normal bowel sounds, soft,, nontender and nondistended./  GU AND RECTAL:  Deferred.  EXTREMITIES:  No clubbing, cyanosis, or edema.  I am unable to palpate pedal  pulses.  NEUROLOGIC:  The patient is alert and  oriented x3.  Cranial nerves and  sensory motor examinations are intact.  PSYCHIATRIC:  The patient becomes somewhat worked up when he talks about  quitting smoking or taking too many medications, insisting that he is not  going to take medications if it makes him feel bad.  SKIN:  No rash.   LABORATORY DATA:  Three sets of point of care enzymes here in the emergency  room are negative.  H&H normal.  PT/INR normal.  Basic metabolic panel  normal.   EKG shows normal sinus rhythm with left anterior fascicular block.   Chest x-ray shows mild bibasilar atelectasis, otherwise negative.   EMERGENCY DEPARTMENT COURSE:  The patient received 50 mg of p.o. metoprolol.  He also received albuterol and several doses of morphine.   ASSESSMENT/PLAN:  1. Chest pain, probably unstable angina.  I will give Lovenox.  He will     continue his aspirin.  He has had problems with hypotension in the past     with beta-blocker and ACE inhibitors and nitroglycerin.  I will defer to     cardiology.  I have spoken with  Dr. Merrilee Seashore., who will see the     patient here in the emergency room.  I will check two more sets of     cardiac enzymes.  2. Hyperlipidemia with noncompliance with his statin.  Will check fasting     lipids again in the morning.  3. Tobacco abuse, counseled against.  For now he will get a nicotine patch.  4. Claudication.  I will check arterial peripheral vascular laboratories to     begin the work-up and if needed, refer him to a vascular surgeon here in     [place], [place], as an outpatient.  5. Recurrent diplopia and facial numbness.  I question whether this could be     transient ischemic attacks and carotid disease.  I will check carotid     Dopplers.  Corinna L. Lendell Caprice, MD    CLS/MEDQ  D:  06/28/2003  T:  06/28/2003  Job:  [id number]

## 2010-11-02 NOTE — Discharge Summary (Signed)
NAME:  Andre Pena, Andre Pena                          ACCOUNT NO.:  [id number]   MEDICAL RECORD NO.:  [id number]                   PATIENT TYPE:  INP   LOCATION:  3710                                 FACILITY:  [facility]   PHYSICIAN:  Corinna L. Lendell Caprice, MD             DATE OF BIRTH:  14-Mar-1956   DATE OF ADMISSION:  06/28/2003  DATE OF DISCHARGE:  06/30/2003                                 DISCHARGE SUMMARY   DIAGNOSES:  1. Unstable angina.  2. Hyperlipidemia.  3. Tobacco abuse.  4. Asymptomatic hypotension.  5. Peripheral vascular disease.  6. Status post angioplasty and stenting of the left common iliac artery     stenosis.   DISCHARGE MEDICATIONS:  1. Aspirin 325 mg p.o. daily.  2. Lipitor 20 mg p.o. daily.  3. Plavix 75 mg p.o. daily.   ACTIVITY:  He may return to work on Monday, which is 4 days from now.   DIET:  Low cholesterol.  He is encouraged to quit smoking.   CONDITION ON DISCHARGE:  Stable.   CONSULTATIONS:  1. Dr. Julieanne Manson.  2. Dr. Nanetta Batty.   PROCEDURES:  1. Cardiac catheterization on June 28, 2003, which showed patent vein     graft, normal left ventricular systolic function.  2. A peripheral angiogram on June 29, 2003, which showed normal renal     arteries and infrarenal abdominal aorta, total right external iliac     artery occlusion, and 80% distal left common iliac artery stenosis.  A     stent was placed, as well, during the procedure.   PERTINENT LABORATORIES:  CBC was significant for a white blood cell count of  11.3; otherwise, unremarkable.  Basic metabolic panel unremarkable.  LDL was  112, VLDL was 15, HDL was 25, total cholesterol 152, triglycerides 76.   SPECIAL STUDIES AND RADIOLOGY:  EKG showed normal sinus rhythm, left  anterior fascicular block.  Chest x-ray showed nothing acute.   HISTORY AND HOSPITAL COURSE:  [name] is a 55 year old white male with a  history of coronary artery disease status post MI x2, coronary  bypass graft,  multiple cardiac catheterizations.  He is noncompliant with his medications  and follow up.  He also continues to smoke.  He presented with chest pain,  which felt like his previous cardiac episodes.  He ruled out for myocardial  infarction by enzymes.  He had been taking an aspirin a day, and his blood  pressure in the past had dropped significantly with nitroglycerin, and,  therefore, he refused nitroglycerin in the emergency room.  He did, however,  get a dose of beta blocker.  He was started on heparin and Integrilin,  cardiology was consulted, and the patient was brought to the cardiac  catheterization lab (results as above).  He had no further chest pain.  He  also had complained upon presentation of severe claudication, and this had  not been  evaluated yet.  Dr. Allyson Sabal was, therefore, consulted and performed  angiogram (results as above).  He also placed a stent (as above).  Also,  during his hospitalization, the patient had mentioned some episodes of  lightheadedness and blindness and facial numbness.  There was concern over  carotid disease, and carotid Dopplers were therefore performed.  They showed  no significant stenosis and antegrade vertebral artery flow.  During his  hospitalization, the patient had blood pressures of 80-90, but was  asymptomatic with this, and he was started on aspirin, Plavix, Lipitor, and  is to follow up with Dr. Allyson Sabal for consideration of bypass procedure for the  occluded left external iliac artery.  He also received smoking cessation  counseling, and voices that he is willing to try to quit smoking, and to be  compliant with his medication.  He is picking up samples of Plavix and  Lipitor at [facility] office.                                                Corinna L. Lendell Caprice, MD    CLS/MEDQ  D:  06/30/2003  T:  06/30/2003  Job:  [id number]   cc:   Nanetta Batty, M.D.  Fax: [phone]   Thereasa Solo. Little, M.D.  [address]  [address]  [place]  [place] [address]  Fax: [phone]

## 2010-11-02 NOTE — Discharge Summary (Signed)
NAMEJAILYN, Andre Pena              ACCOUNT NO.:  [id number]   MEDICAL RECORD NO.:  [id number]          PATIENT TYPE:  OUT   LOCATION:  OCC                          FACILITY:  [facility]   PHYSICIAN:  Yetta Barre, M.D.  DATE OF BIRTH:  [birth year]   DATE OF ADMISSION:  01/01/2005  DATE OF DISCHARGE:  01/02/2005                                 DISCHARGE SUMMARY   ADDENDUM:  Please send Dr. Arbie Cookey copy of ABI.       SS/MEDQ  D:  01/01/2005  T:  01/02/2005  Job:  [id number]   cc:   Larina Earthly, M.D.  [address]  [place]  [place] [address]

## 2010-11-02 NOTE — Cardiovascular Report (Signed)
NAMEJAREK, LONGTON                          ACCOUNT NO.:  [id number]   MEDICAL RECORD NO.:  [id number]                   PATIENT TYPE:  INP   LOCATION:  3710                                 FACILITY:  [facility]   PHYSICIAN:  Thereasa Solo. Little, M.D.              DATE OF BIRTH:  [birth year]   DATE OF PROCEDURE:  06/28/2003  DATE OF DISCHARGE:                              CARDIAC CATHETERIZATION   INDICATIONS FOR TEST:  This 55 year old noncompliant male had had bypass  surgery in the past.  He has not taken any medications other than his  aspirin.  He presented to the emergency room with recurrent episodes of  anginal type pain.  His cardiac enzymes are negative so far and he is  brought to the cath lab for intervention.   The patient was prepped and draped in the usual sterile fashion exposing the  right groin. Following local anesthetic with 1% Xylocaine, the Seldinger  technique was employed and a 5 [place] introducer sheath was placed into the  left femoral artery.  Left and right coronary arteriography and  ventriculography in the RAO projection was performed.  Guide wire exchange  techniques were used.   TOTAL CONTRAST:  110 mL.   COMPLICATIONS:  None.   EQUIPMENT:  5 French Judkins configuration catheters.   RESULTS:   HEMODYNAMIC MONITORING:  Central aortic pressure was 98/67.  Left  ventricular pressure was 99/9 with no aortic valve gradient at the time of  pullback.   VENTRICULOGRAPHY:  Ventriculography in the RAO projection revealed normal  wall motion.  Ventricular ectopy was noted and mitral regurgitation was  noted, but that was due to catheter induced ectopy.  Ejection fraction was  greater than 60%.  The end-diastolic pressure at rest was 16.   CORONARY ARTERIOGRAPHY:  1. Left main normal.  2. Circumflex:  The circumflex was 100% occluded in its mid portion just     proximal to a stent that was visible on fluoroscopy.  3. LAD:  The LAD had proximal  irregularities of 20-30%.  The first and     second diagonal were free of disease.  The distal LAD was free of     disease.  4. Right coronary artery:  The native right coronary artery had proximal 50     and mid 80% area of narrowing.  The 80% area of narrowing was     approximately 18 mm in length.  Minor irregularities of the distal     portion.  The PDA was 100% occluded, but the posterior lateral vessel was     widely patent.  5. Saphenous vein graft to the PDA.  The graft itself was widely patent.     PDA was widely patent.  There was retrograde filling into the posterior     lateral vessel and up to the acute margin of the heart.  No high grade     stenosis  was seen.  6. Internal mammary artery to the distal OM:  The left internal mammary     artery was widely patent.  The distal OM was small, but widely patent.   CONCLUSION:  1. Normal left ventricular systolic function.  2. Widely patent saphenous vein graft to the posterior descending artery and     internal mammary artery to the OM with only minor irregularities in the     left anterior descending.   At this point, all grafts were patent and there were no high grade stenosis  that needs any intervention.  The best treatment for this gentleman is  medication, but the patient must become compliant to take the medications  for there to be any significant reduction in his complaints.   At a previous catheterization August 2003, he had a 70% lesion in his right  iliac and complains of rather significant right leg claudication.  Because  of this, I have talked with Dr. Allyson Sabal who will evaluate him for potential  peripheral angiography tomorrow and intervention to his right iliac.                                               Thereasa Solo. Little, M.D.    ABL/MEDQ  D:  06/28/2003  T:  06/28/2003  Job:  [id number]   cc:   Cristy Hilts. Jacinto Halim, M.D.  [address]  [place]  [place] [address]  Fax: [phone]   Corinna L. Lendell Caprice,  MD

## 2010-11-02 NOTE — Cardiovascular Report (Signed)
NAMEBREYDON, SENTERS                          ACCOUNT NO.:  [id number]   MEDICAL RECORD NO.:  [id number]                   PATIENT TYPE:  INP   LOCATION:  3710                                 FACILITY:  [facility]   PHYSICIAN:  Nanetta Batty, M.D.                DATE OF BIRTH:  January 24, 1956   DATE OF PROCEDURE:  DATE OF DISCHARGE:                              CARDIAC CATHETERIZATION   PROCEDURES PERFORMED:  1. Peripheral angiogram.  2. Percutaneous transluminal angioplasty and stent.   CARDIOLOGIST:  Nanetta Batty, M.D.   INDICATIONS FOR PROCEDURE:  Mr. Nydam is a 55 year old white male with  history of CAD status post coronary artery bypass grafting July 2002 with a  LIMA to OM and a vein graft to the PDA.  He was admitted with unstable  angina on January 11th and underwent diagnostic coronary arteriography by  Dr. Caprice Kluver yesterday revealing patent grafts with noncritical disease  otherwise.  He does complain of right lower extremity claudication.  He  presents now for angiography and potential endovascular therapy.   PROCEDURE DESCRIPTION:  The patient was brought to the sixth floor [facility] peripheral vascular angiographic suite in the post absorptive state.  He was premedicated with p.o. Valium.  His right and left groins were  prepped and shaved in the usual sterile fashion.  One percent Xylocaine was  used for local anesthesia.  Attempts were made to cannulate the right  femoral artery using a 6 French sheath; however, the wire would not track  beyond the common femoral and thus the left common femoral was cannulated.   ____________ dye was used for the entirety of the case.  Periodic aortic  pressures were monitored during the case.   ANGIOGRAPHIC DATA:  1. Abdominal Aorta:     A. Renal arteries - normal.     B. Infrarenal abdominal aorta - normal.   1. Right Lower Extremity:     A. Total right external iliac artery just after the bifurcation with  reconstitution of the common femoral by hypogastric collaterals just        above the femoral head.     B. Normal superficial femoral artery with three-vessel runoff.   1. Left Lower Extremity:     A. Eighty percent eccentric distal left common iliac artery stenosis with        a 30 mm pullback gradient.     B. Normal superficial femoral artery and three-vessel runoff.   PROCEDURE DESCRIPTION:  A 6 French 30 cm long __________ tip Cordis sheath  was then advanced up to the level of the stenosis with a Wholey wire  crossing the stenosis into the distal abdominal aorta.  A 9 x 4 SMART stent  was then deployed primarily just distal to the bifurcation of the common  iliacs and just up to the hypogastric.  PTA was then performed using the A-2  PowerFlex resulting in  reduction of 80% distal eccentric left common iliac  artery stenosis to 0% residual.   The patient tolerated the procedure well.   The ACT was measured and the sheaths were removed.  Pressure was held on the  groin to achieve hemostasis.   The patient left the lab in stable condition.   Given the proximity of the external iliac occlusion at the femoral head I  think he would be a difficult candidate to perform percutaneous  revascularization.  He may require fem-fem crossover grafting.   I will review the films with Dr. Alanda Amass.                                               Nanetta Batty, M.D.    JB/MEDQ  D:  06/29/2003  T:  06/30/2003  Job:  [id number]   cc:   Sixth Floor [facility] Peripheral Vascular Angiographic Suite   [facility]  [address]  [place], [place]  [address]   Corinna L. Lendell Caprice, MD

## 2010-11-02 NOTE — Cardiovascular Report (Signed)
Andre Pena, Andre Pena                          ACCOUNT NO.:  [id number]   MEDICAL RECORD NO.:  [id number]                   PATIENT TYPE:  INP   LOCATION:  2032                                 FACILITY:  [facility]   PHYSICIAN:  Darlin Priestly, M.D.             DATE OF BIRTH:  02-28-1956   DATE OF PROCEDURE:  01/15/2002  DATE OF DISCHARGE:                              CARDIAC CATHETERIZATION   PROCEDURE:  1. Left heart catheterization.  2. Coronary angiography.  3. Left ventriculogram.  4. Saphenous vein graft.  5. Left internal mammary artery angiography.  6. Abdominal aortogram.   COMPLICATIONS:  None.   INDICATIONS FOR PROCEDURE:  Andre Pena is a 55 year old male patient of Dr.  Scotty Court with a history of CAD status post coronary bypass surgery  consisting of a LIMA to OM and a vein graft to RCA.  The patient has  undergone multiple cardiac catheterizations and interventions with stenting  of the circumflex which was ultimately occluded, prompting his bypass  grafting.  The patient also has complained of claudication.  He was admitted  on January 14, 2002, with recurrent angina.  He is now referred for repeat  catheterization.   DESCRIPTION OF PROCEDURE:  After giving informed written consent, the  patient was brought to the cardiac catheterization laboratory where his  right and left groin were shaved, prepped, and draped in the usual sterile  fashion.  ECG monitoring was established.  Using modified Seldinger  technique, a #6 French arterial sheath was inserted in the left femoral  artery.  The #6 [place] diagnostic catheters were then used to perform  diagnostic angiography.   FINDINGS:  1. This reveals a large left main with no significant disease.  2. The left anterior descending artery is a large vessel which coursed to     the apex and gives rise to three diagonal branches.  The left anterior     descending artery has no significant disease.  The first and third  diagonals are small vessels with no significant disease.  The second     diagonal is a medium-sized vessel with no significant disease.  3. The left circumflex gives rise to two obtuse marginal branches and is     noted to be totally occluded in its mid portion at the site of a     previously placed stent.  There is a small first obtuse marginal with no     significant disease.  The second obtuse marginal fills via a patent left     internal mammary graft.  There is no significant disease in the body of     the graft or distal to the graft insertion.  4. The right coronary artery is a medium-sized vessel which is dominant and     gives rise to a posterior descending artery as well as a posterolateral     branch.  The right coronary artery is  noted to be diffusely diseased     throughout its proximal and mid portions, up to 80-90%.  This does gives     rise to a medium-sized posterior descending artery and a posterolateral     branch.  This does retrograde fill a patent saphenous vein graft to the     posterior descending artery.  A second injection of the vein graft to the     posterior descending artery reveals no significant disease in the body of     the graft or distal to the graft insertion.  5. A left ventriculogram reveals a mildly depressed ejection fraction of 40-     45% with anterolateral hypokinesis.  6. An abdominal aortogram reveals no evidence of renal artery stenosis.     There does appear to be approximately 80% mid right iliac stenosis with     moderate diffuse disease of the left iliac.   HEMODYNAMICS:  Systemic arterial pressure 82/54, LV systemic pressure 80/18,  LVEDP 12.   CONCLUSION:  1. Significant two-vessel coronary artery disease.  2. Patent left internal mammary artery to second obtuse marginal.  3. Patent vein graft to the posterior descending artery.  4. Mildly depressed left ventricular systolic function with wall motion     abnormalities noted above.   5. No evidence of renal artery stenosis with approximately 80% mid right     iliac and moderate diffuse disease of the left iliac.                                               Darlin Priestly, M.D.    RHM/MEDQ  D:  01/15/2002  T:  01/20/2002  Job:  [id number]   cc:   Ellin Saba., M.D.

## 2010-11-02 NOTE — Discharge Summary (Signed)
Andre Pena                          ACCOUNT NO.:  [id number]   MEDICAL RECORD NO.:  [id number]                   PATIENT TYPE:  INP   LOCATION:  2899                                 FACILITY:  [facility]   PHYSICIAN:  Cristy Hilts. Jacinto Halim, M.D.                  DATE OF BIRTH:  1956/02/10   DATE OF ADMISSION:  01/14/2002  DATE OF DISCHARGE:  01/15/2002                                 DISCHARGE SUMMARY   ADMISSION DIAGNOSES:  1. Unstable angina.  2. Coronary artery disease.     A. History of myocardial infarction.     B. History of multiple cardiac catheterizations with interventions prior        to bypass surgery.     C. Status post coronary bypass grafting x 2 vessels, July 2000, with left        internal mammary artery to the obtuse marginal and saphenous vein        graft to posterior descending artery.     D. Status post catheterization 01/21/00.  3. Hyperlipidemia.  4. Ejection fraction 40% to 45% at catheterization in August 2001.  5. Gastroesophageal reflux disease.  6. Asthma.   DISCHARGE DIAGNOSES:  1. Unstable angina.  2. Coronary artery disease.     A. History of myocardial infarction.     B. History of multiple cardiac catheterizations with interventions prior        to bypass surgery.     C. Status post coronary bypass grafting x 2 vessels, July 2000, with left        internal mammary artery to the obtuse marginal and saphenous vein        graft to posterior descending artery.     D. Status post catheterization 01/21/00.  3. Hyperlipidemia.  4. Ejection fraction 40% to 45% at catheterization in August 2001.  5. Gastroesophageal reflux disease.  6. Asthma.  7. Status post cardiac catheterization 01/15/02 by Dr. Lenise Herald     revealing patent bypass grafts and no significant native vessel disease     distal to the graft anastomoses.  Ejection fraction 40% to 45%.  No renal     artery stenosis, 80% mid right iliac artery stenosis, diffuse disease of     the left  iliac.  8. Lower extremity dopplers are to be performed in the hospital on the     afternoon prior to admission.  Needs to be followed up as an outpatient.  9. Hypotension and bradycardia with beta blockers.  Not symptomatic.   HISTORY OF PRESENT ILLNESS:  Andre Pena is a 55 year old white male with a  history of CAD, who presented to the emergency room on 01/14/02 with  complaints of chest pain.  He stated that on that day, tightness in his  chest began while watching television.  It progressively worsening and then  he had radiation to the left arm with numbness.  He then had some shortness  of breath and diaphoresis as well as nausea but no vomiting.  He used no  nitroglycerin.  He had a history of previous hypotension with using  nitroglycerin.  However, he was given two doses of morphine 4 mg in the  emergency room and had almost complete relief of his pain.  EKG showed no  acute changes and the first set of cardiac enzymes were negative.  His  discomfort started about 10:00 in the morning and has gone until 12 noon.  It is rated at 10/10.  He came to the emergency room at about 2 p.m. and was  given morphine.  After the morphine , at the time of our interview he was  having some pressure but none of the other type of pain.   On exam at that time, he was stable with a blood pressure of 131/81, heart  rate 88, oxygen saturation 99%.  No significant abnormalities on physical  exam.  Negative EKG without significant change.  At that time, he was seen  and evaluated by Dr. Jeanella Cara.  It was felt that his chest pain was somewhat  atypical but given his known his coronary disease and his risk factors, he  needed significant evaluation.  It was discussed that he had significant  claudication symptoms, right greater than left.  We will need to follow up  for lower extremity peripheral disease.   It was felt that given his complex history, the fact that he was continuing  to smoke and the  fact that he was noncompliant with medications that we need  to proceed with definitive cardiac catheterization.  He was placed on IV  heparin and nitroglycerin , check enzymes and plans for catheterization the  following day.  The risks and benefits of the procedure were discussed and  he is willing to proceed.   HOSPITAL COURSE:  On 01/15/02 Andre Pena underwent cardiac catheterization by  Dr. Lenise Herald.  His LAD was free of significant disease.  The LIMA to  the OM was patent.  There was no significant native disease distal to the  graft site.  Saphenous vein graft to the PDA was patent.  There was no  disease distal to the anastomotic site.  EF was 40 to 45%.  No renal artery  stenosis.  Right iliac had 80% mid stenosis.  There was diffuse disease of  the left iliac.  He tolerated the procedure well without complications.   Post catheterization, Andre Pena was to have lower extremities Dopplers  performed this afternoon prior to his discharge home.   AT this time, post procedure he remains hemodynamically stable.  However,  both his heart rate and blood pressure have been slightly low on 12.5 b.i.d.  of  Lopressor, therefore, I am not sending home with any beta blocker.  His  blood pressure has been maintaining 80 to 100 systolically and his heart  rate is in the 50s and 60s.  At this time, his groin site is notable with no  hematoma or bleed.   His lower extremity Dopplers will be performed here in the hospital.  Once  his bedrest is complete, if he remains hemodynamically and his groin is  stable he will be discharged home at that time.   HOSPITAL CONSULTS:  None   HOSPITAL PROCEDURES:  1. Cardiac catheterization on 01/15/02 by Dr. Lenise Herald.  Please see his     dictated report for details as that is  not available to me.  The LAD and     its branches have no significant disease.  Left main had no significant    disease.  The left circumflex native vessel is occluded.   The IMA to the     OM vessel is patent.  There is no disease distal to the graft.  The     native RCA does have disease.  However, the saphenous vein graft to the     PDA is patent.  There was no disease distal to the graft sites.  EF is 40     to 45%.  There was no renal artery stenosis.  The right iliac had 80% mid     stenosis.  There is diffuse disease of the left iliac.  2. Andre Pena is to undergo lower extremity Dopplers this afternoon prior     to his discharge home.   LABORATORY DATA:  His preprocedure labs were stable.  White count 9.5,  hemoglobin 16.1, hematocrit 48.1,  platelets 305.  Sodium 138, potassium  3.7, BUN 11, creatinine 1.1, glucose 88.  Cardiac enzymes were negative.  I  do not have access  to the last two sets at this time but the first set  shows CK 105, MB 1.0, troponin less than 0.01.   RADIOLOGY:  Chest x-ray showed no acute disease.  EKG showed normal  sinus  rhythm.  No ST-T change.   DISCHARGE MEDICATIONS:  1. Enteric coated aspirin 325 mg q.d.  2. Lipitor, he was uncertain of the dose, but he was to continue at his     current dose.  3. Prevacid 30 mg q.d.   I am not adding any beta blocker or ACE inhibitor to his home medications as  his blood pressure here in the Short Stay post catheterization has been 80  to 100 systolically.  As well, his heart rate has remained in the 50s and  60s.   DISCHARGE ACTIVITIES:  No strenuous activity, lifting over five pounds,  driving or sexual activity for three days.   DISCHARGE DIET:  Low salt, low fat, low cholesterol diet.   DISCHARGE INSTRUCTIONS:  1. May gently wash the groin with warm water and soap.  2. Call [phone] for any bleeding or increased pain in groin.   FOLLOW UP:  Follow up with Dr. Jacinto Halim, 02/05/01 at 3:00 p.m.   He is to have is to have his lower extremity Doppler's done here in the  hospital prior to his discharge home this afternoon.        Mary B. Remer Macho, P.A.-C.                    Cristy Hilts. Jacinto Halim, M.D.    MBE/MEDQ  D:  01/15/2002  T:  01/21/2002  Job:  [id number]   cc:   Ellin Saba., M.D.   Cristy Hilts. Jacinto Halim, M.D.

## 2010-11-02 NOTE — Discharge Summary (Signed)
Andre Pena, Andre Pena              ACCOUNT NO.:  [id number]   MEDICAL RECORD NO.:  [id number]          PATIENT TYPE:  INP   LOCATION:                               FACILITY:  [facility]   PHYSICIAN:  Alvester Morin, M.D.  DATE OF BIRTH:  02-14-1956   DATE OF ADMISSION:  12/26/2004  DATE OF DISCHARGE:  12/27/2004                                 DISCHARGE SUMMARY   PROBLEM #1:  Dizziness and double vision secondary to chronic sinusitis or  cerumen impaction.   PROBLEM #2:  Coronary artery disease. History of myocardial infarction and  CABG in July 2000. Cardiac catheterization done in 2001, 2003, and 2005. The  catheterization done in 2005 showed normal LV function and the circumflex  was occluded 100% proximal to the stent. Right coronary artery 50% to 80%  proximal and mid arterial anomaly. PDA 100% occluded, but the posterolateral  is patent. Graft is patent.   PROBLEM #3:  Hyperlipidemia.   PROBLEM #4:  Gastroesophageal reflux disease.   PROBLEM #5:  History of hypertension and bradycardia with beta blockers.   PROBLEM #6:  Peripheral vascular disease, status post angioplasty and  stenting of the left common iliac artery stenosis. Claudication present.   PROBLEM #7:  Tobacco use.   MEDICATIONS:  1.  Aspirin 325 mg p.o. OD.  2.  Lipitor 40 mg p.o. OD.  3.  Protonix 40 mg p.o. OD.   DISPOSITION:  The patient has an appointment with Dr. Arbie Cookey with [facility] on  January 31, 2005, at 1 p.m. and a hospital follow-up appointment with Dr.  Liliane Channel on January 09, 2005, at 3 p.m.   PROCEDURES DONE THIS ADMISSION:  None.   CONSULTATIONS DONE THIS ADMISSION:  None.   IMAGING THIS ADMISSION:  MRI of the brain done with and without contrast on  November 26, 2004, showed normal picture and sinusitis. MRA of head done on November 26, 2004, showed normal intracranial MR angiography of the large and medium  size vessels. CT head without contrast done on December 26, 2004, was negative  for any evidence of  intracranial hemorrhage, brain edema, or mass effect.  However, changes of chronic ethmoid sinusitis were noted. Carotid Dopplers  done on December 27, 2004, showed no evidence of ICA stenosis bilaterally and  vertebral artery flow was antegrade. ABIs done on December 27, 2004, indicate a  moderate reduction on the right and normal on the left at rest. Duplex scan  showed no evidence of significance plaque noted throughout the leg, common  femoral to PTA. Probable right iliofemoral disease. Lower extremity arterial  evaluation with exercise showed a brachial pressure on the right side of 102  and the left of 110. The right ankle pressure was 80 with ankle/brachial  indices 0.73 and left ankle pressure 120 with ankle/brachial indices of more  than 1.0.   HISTORY OF PRESENT ILLNESS:  Andre Pena is a 55 year old man with a past  medical history of coronary artery disease, with a CABG done in 2000 with  peripheral vascular disease, status post angioplasty and stenting of left  common iliac  artery. He was brought to the ED by his wife with complaints of  dizziness and double vision for the past two months. Dizziness came before  the double vision started. The patient feels lightheaded and does not feel  like the room is spinning around him. The patient says that there is no  change in the way he feels with change of posture from side to side. He has  no history of seizures and no loss of consciousness. Usually the dizziness  comes all of a sudden with no specific timeframe. Each episodes lasts about  five minutes and can extend to about an hour. It is not associated with  nausea, vomiting, or headache. However, he does have some neck soreness.  No  history of tinnitus. The patient also complains of difficulty in walking for  long distances; about a quarter mile is how much he can walk before he needs  to sit down for about five minutes.   ALLERGIES:  No known drug allergies.   PAST MEDICAL  HISTORY:  1.  Coronary artery disease, history of MI; CABG done in 2000; cardiac      catheterization done in 2001, 2003, and 2005. The reports are as      indicated above.  2.  Hyperlipidemia.  3.  GERD.  4.  Peripheral vascular disease.  5.  Tobacco use.   The patient claims he was not taking any medications.   He is a current smoker smoking about two packs per day for the past 37  years. He does not drink alcohol around 2-3 times a week, about 5 to 6  beers. No history of illicit drug use. He is married. He works in  Manufacturing engineer. He has [employer]. He lives with his  wife and two children.   FAMILY HISTORY:  His mother is alive. She has hypertension and has a history  of stroke. His father died at age 47 of __________, but he also had a  history of stroke. He has two siblings and they are both alive and healthy.  He has four children alive and healthy.   PHYSICAL EXAMINATION:  Pulse 78, blood pressure 126/78, temperature 98.9,  respirations 18, O2 saturation 97% on room air. The patient did not appear  in any acute distress. There was a mild amount of nystagmus seen. There was  no fast component or low component of the nystagmus. Fundus normal. Pupils  equal and reactive to light. Extraocular movements were intact. His  oropharynx was clear and mucosa was moist. There was no rigidity of the  neck. There was a small nodule in the thyroid gland. Lungs were clear to  auscultation bilaterally. No wheezes or rhonchi. Normal rate and rhythm. No  murmurs, rubs, or gallops. Abdomen soft. No distention. Bowel sounds  present. Extremities revealed no pedal edema. No rashes or petechia on the  skin seen. Neurologically, he was alert and oriented times three. Sensation  decreased on the right leg from the knee downward to pinprick. On gait exam  he kind of leaned toward the left. The reflexes are 2+ and equal bilaterally. Strength was 5/5 bilaterally and  symmetric.   LABORATORY DATA:  Sodium 142, potassium 4.4, chloride 107, bicarbonate 27,  BUN 13, creatinine 0.9, glucose 93. Hemoglobin 14.4, hematocrit 46.3, MCV  91.8, white blood cell count 9.6, platelet count 303,000. Also RDW was 14%,  monocytes 1.0.  Absolute lymphocyte count was 4.2 and atypical lymphocytes  were seen. PT 13, INR 1.0.  His pH was 7.37, PCO2 46, HCO3 27. TSH 1.596.  Homocysteine 10.6.  ANA negative. Lipid panel pressure 179, triglycerides  51, HDL 46, LDL 123. VLDL 10. RPR nonreactive. B12 was 450.  ESR 15.  Urine  drug screen none detected.   ASSESSMENT/PLAN:  1.  This is a 55 year old man with a past medical history of coronary artery      disease, coronary artery bypass graft in 2000, peripheral vascular      disease, status post stent in the left common iliac artery in 2005,      coming in with complaints of dizziness and double vision. The patient      was worked up extensively and all the procedures or imaging done were      found to be negative and the only positive findings was chronic      sinusitis seen on the MRI, though the symptoms could have been secondary      to either the chronic sinusitis or on examination of his ear we saw that      there was wax impaction. So the patient was advised how to keep his ears      clean and to use only for the sinusitis.  2.  Peripheral vascular disease. On ABI the patient changed from 0.73 to      0.47 with exercise. So therefore an appointment was made with [facility] so      that further management could be done by then.  3.  Hyperlipidemia. The patient was started Lipitor 40 mg once daily to      decrease the risks of stroke.  4.  Gastroesophageal reflux disease. The patient was put on Protonix 40 mg      p.o. daily.  5.  Tobacco use. The patient was counseled regarding smoking cessation and      was given the adverse effects of smoking especially with a history of      peripheral vascular disease. The patient,  however, was very reluctant      and not inclined to smoking even after giving all the advice.  Nicotine      patches were also recommended. However, the patient refused.      Sange   SS/MEDQ  D:  01/01/2005  T:  01/02/2005  Job:  [id number]

## 2010-11-02 NOTE — Cardiovascular Report (Signed)
[facility]  Patient:    Andre Pena, Andre Pena                       MRN: [id number] Proc. Date: 01/21/00 Adm. Date:  [id number] Attending:  Lenise Herald H CC:         [name], M.D.             Armanda Magic, M.D.                        Cardiac Catheterization  PROCEDURES: 1. Left heart catheterization. 2. Coronary angiography. 3. Left ventriculogram. 4. Saphenous vein graft. 5. Left internal mammary angiography.  COMPLICATIONS:  None.  INDICATIONS:  Andre Pena is a 55 year old white male with a history of tobacco abuse and significant CAD, status post multiple percutaneous coronary interventions, status post coronary artery bypass surgery in July of 2000 consisting of a LIMA to OM and vein graft to PDA.  The patient was admitted on January 21, 2000, with recurrent chest pain.  He is now referred for cardiac catheterization to redefine his coronary status.  DESCRIPTION OF PROCEDURE:  After given informed written consent, the patient was brought to the cardiac catheterization lab where his right and left groins were shaved, prepped, and draped in the usual sterile fashion.  ECG monitoring was established.  Using modified Seldinger technique, a #6 French arterial sheath was inserted in the right femoral artery.  A 6 French diagnostic catheter was then used to perform diagnostic angiography.  This revealed a medium sized left main with no significant disease.  The LAD is a large vessel, which coursed to the apex and gave rise to two diagonal branches.  The LAD has no significant disease.  The first and second diagonals are medium sized vessels with no significant disease.  The left circumflex is a medium sized vessel which is noted to be totally occluded in the mid AV groove and a previously placed stent.  There is one small OM with no significant disease.  There is a second OM which fills the LIMA graft which is inserted into the midportion  of the second OM and has no significant disease in the body of the IMA or distal to the graft insertion.  The right coronary artery is a medium sized vessel which is noted to be diffusely diseased and is dominant, and gave rise to both the PDA as well as posterolateral branch.  The RCA has an 80% ostial lesion and diffuse disease throughout the proximal and early mid segments.  There is diffuse 70% stenotic narrowing in the distal mid and distal RCA.  The PDA and posterolateral branch have no significant disease.  There is a saphenous vein graft to the PDA which has no significant disease in the body of the graft or distal to the graft insertion.  LEFT VENTRICULOGRAM:  The left ventriculogram reveals a mildly depressed EF calculated at 40-45% with mild anterolateral hypokinesis.  There is no significant MR.  HEMODYNAMICS:  Arterial pressure 98/58, LV systemic pressure 98/2, LVEDP of 12.  CONCLUSIONS: 1. Patent left internal mammary artery to second obtuse marginal with    no significant disease in the body of the graft or distal to the graft    insertion. 2. Patent saphenous vein graft to the posterior descending artery with no    significant disease in the body of the graft or distal to the graft    insertion.  3. Significant two-vessel coronary artery disease. 4. Mildly depressed ejection fraction with wall motion abnormalities as noted    above. DD:  01/21/00 TD:  01/22/00 Job: [id number]

## 2011-03-28 ENCOUNTER — Other Ambulatory Visit: Payer: Self-pay

## 2012-02-19 IMAGING — CR DG CHEST 2V
2 series · 2 of 2 positions shown · non-contrast
Comparison: Chest 07/08/2008.

CLINICAL DATA: Cough.  Peripheral vascular disease.  Emphysema.

CHEST - 2 VIEW

[view not recorded (1 of 2)]
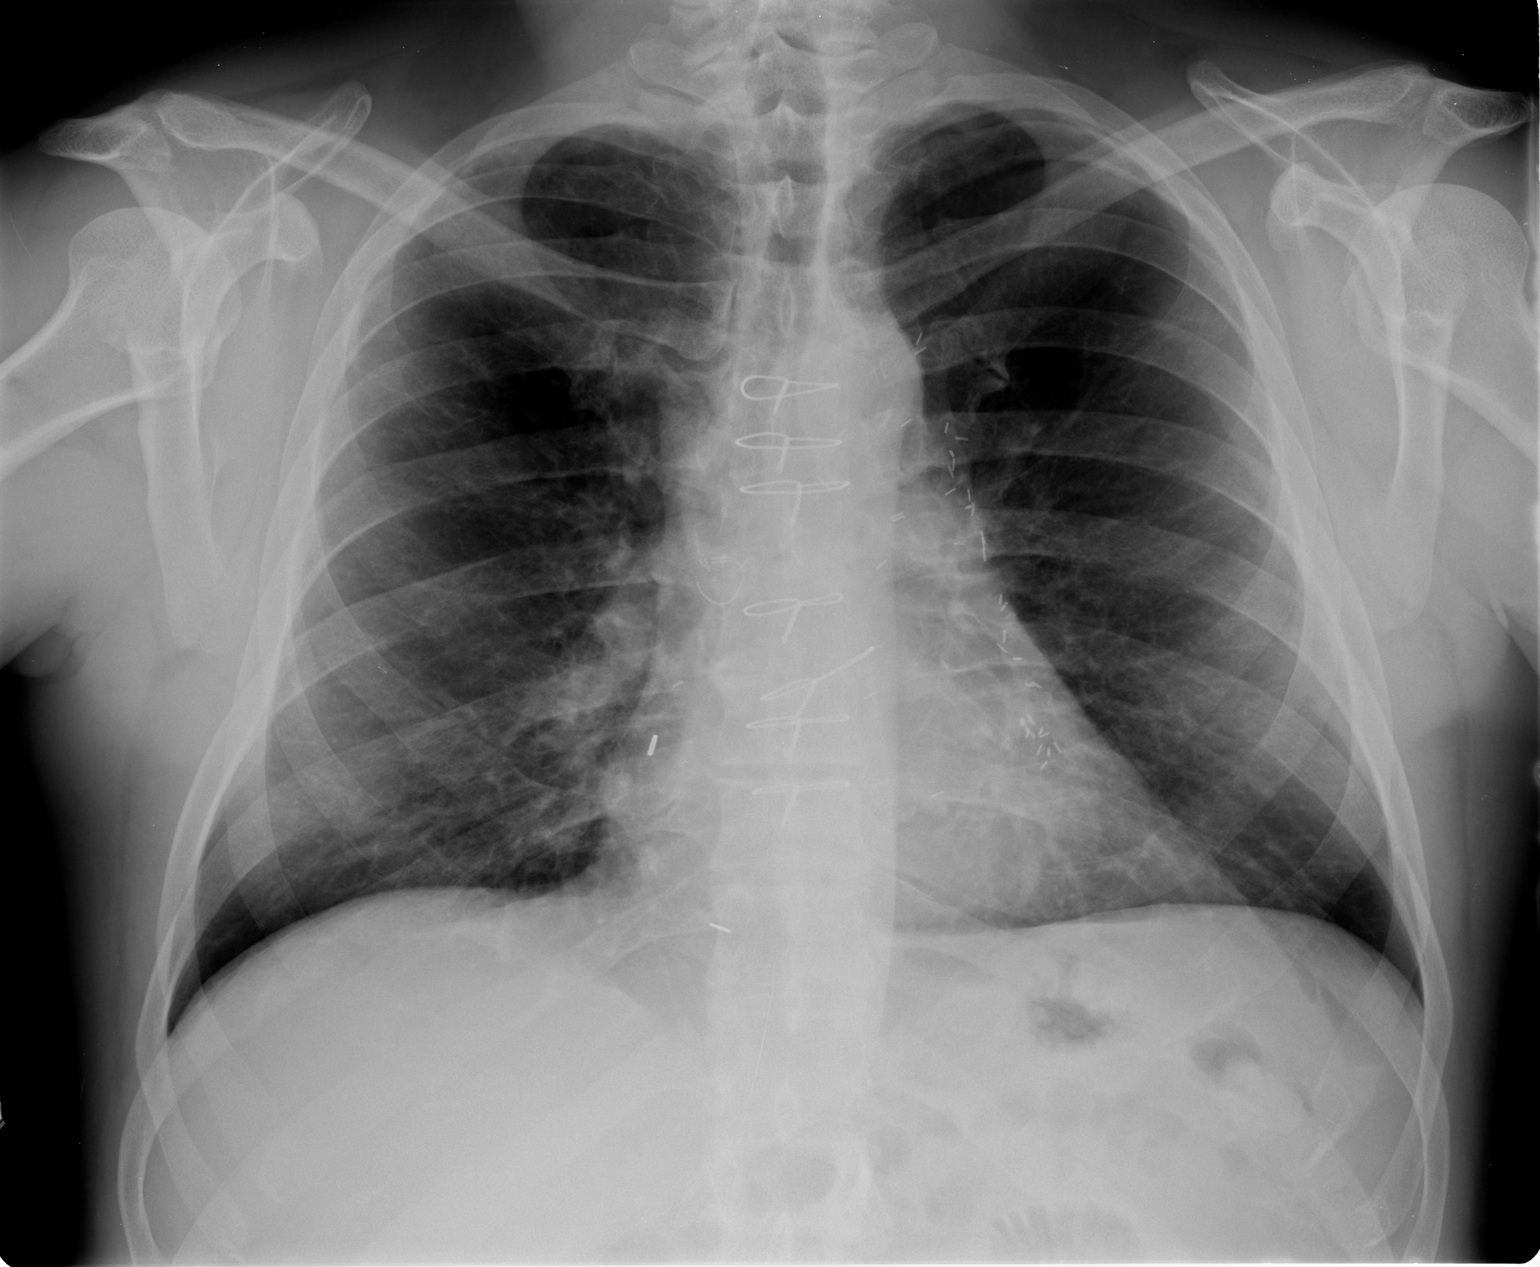

[view not recorded (2 of 2)]
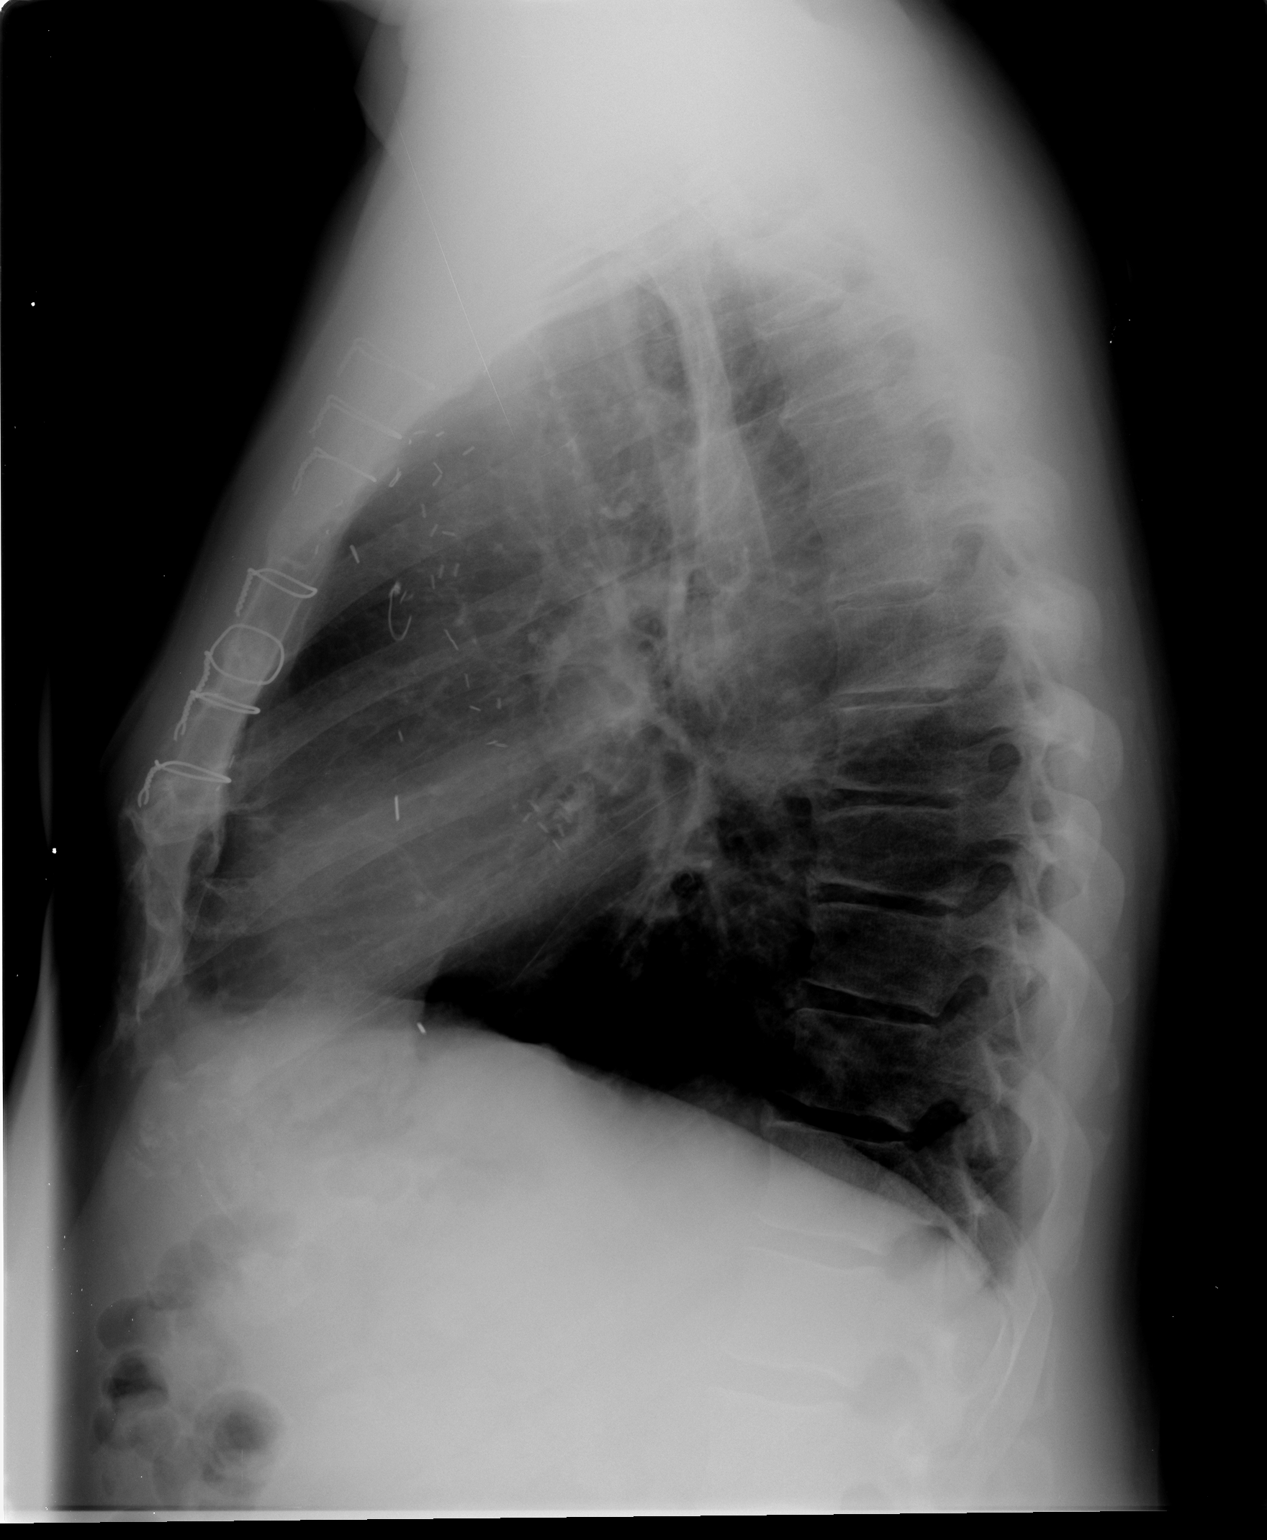

[2 of 2 positions shown; findings below may reference images not displayed]

FINDINGS: The patient is status post CABG.  The chest is
hyperexpanded compatible with emphysema but no focal airspace
disease or effusion is identified.  No pneumothorax.
IMPRESSION: Emphysema without acute disease.

## 2012-02-23 IMAGING — CR DG CHEST 1V PORT
1 series · 1 of 1 positions shown · non-contrast
Comparison: [DATE]

CLINICAL DATA: Claudication, aortic bifemoral bypass

PORTABLE CHEST - 1 VIEW

[view not recorded]
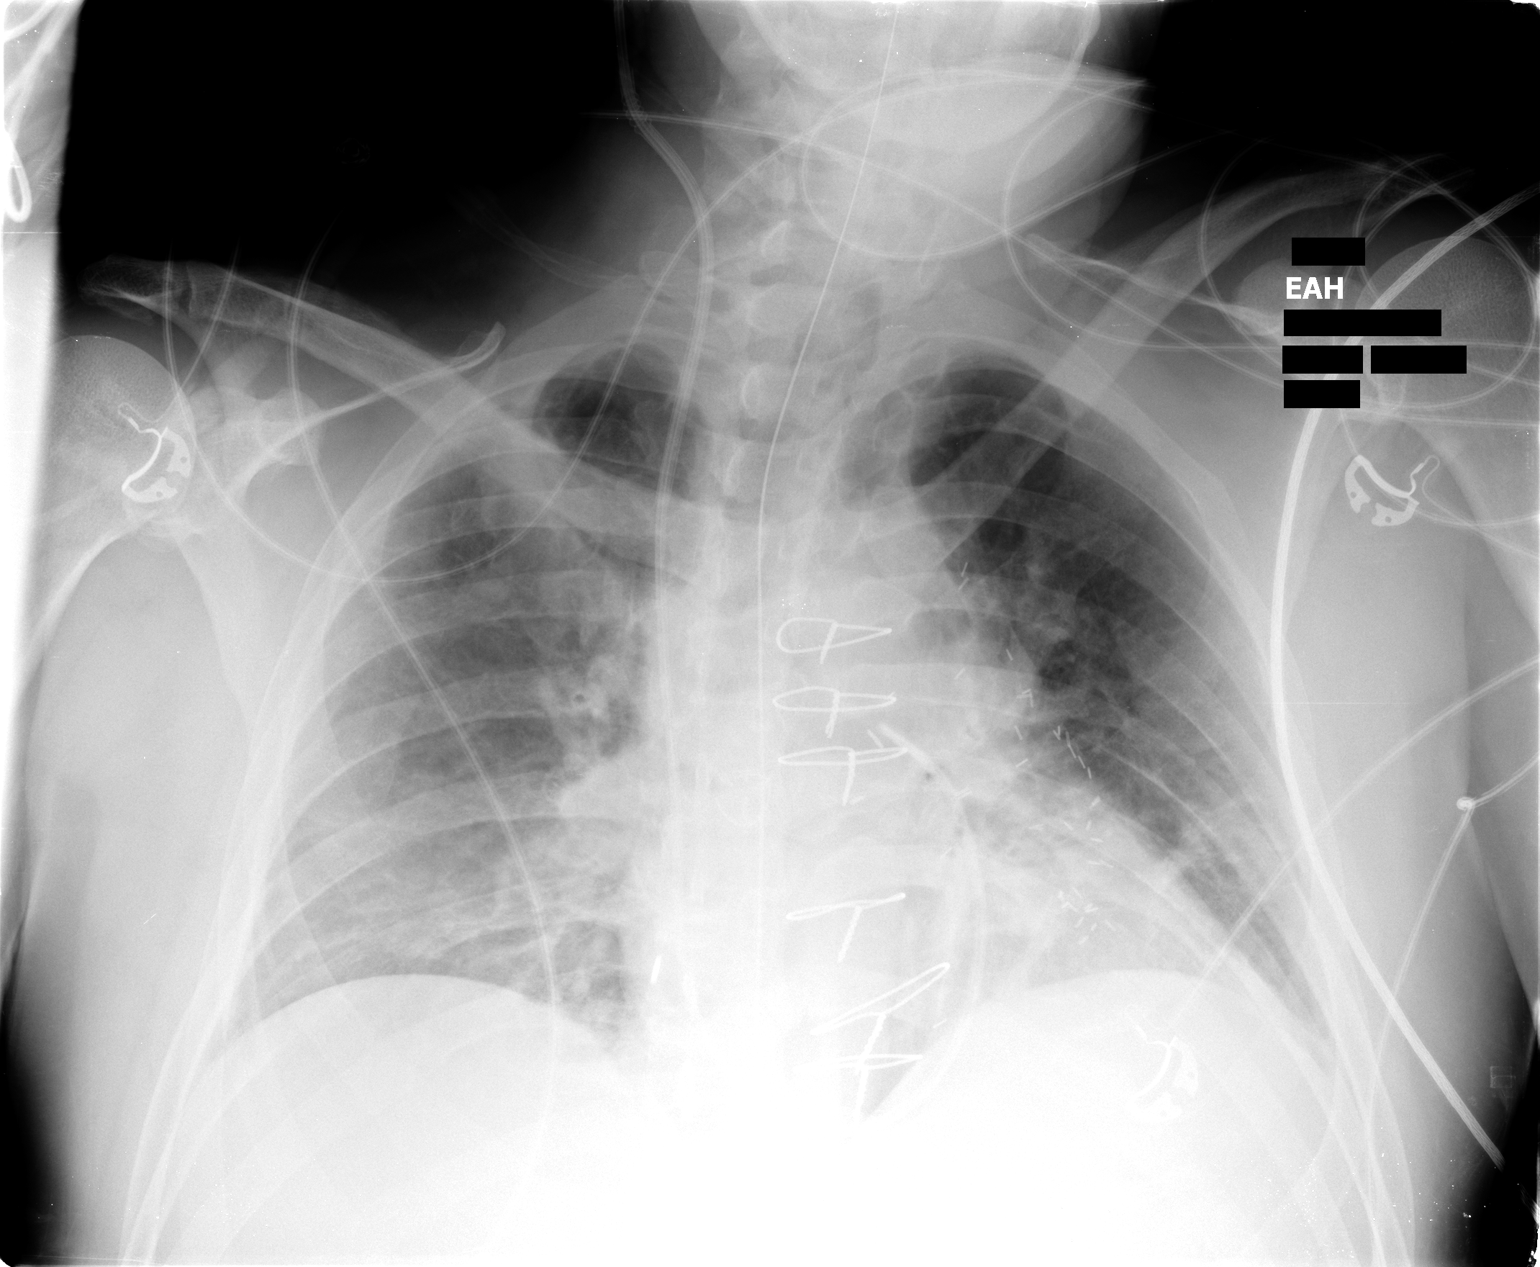

[1 of 1 positions shown; findings below may reference images not displayed]

FINDINGS: Stable support apparatus.  Previous coronary bypass
changes noted.  The cardiac silhouette remains enlarged with
central vascular congestion, low lung volumes and scattered
atelectasis.  No enlarging effusion or developing pneumothorax.
IMPRESSION: Cardiomegaly with residual vascular congestion and scattered
atelectasis.

## 2012-06-29 ENCOUNTER — Encounter: Payer: Self-pay | Admitting: Cardiology

## 2015-07-06 DIAGNOSIS — Z23 Encounter for immunization: Secondary | ICD-10-CM

## 2015-07-13 ENCOUNTER — Emergency Department (INDEPENDENT_AMBULATORY_CARE_PROVIDER_SITE_OTHER): Payer: Medicare Other

## 2015-07-13 ENCOUNTER — Encounter (HOSPITAL_COMMUNITY): Payer: Self-pay | Admitting: *Deleted

## 2015-07-13 ENCOUNTER — Emergency Department (INDEPENDENT_AMBULATORY_CARE_PROVIDER_SITE_OTHER)
Admission: EM | Admit: 2015-07-13 | Discharge: 2015-07-13 | Disposition: A | Discharge status: Home / Self Care | Payer: Medicare Other | Source: Home / Self Care | Attending: Family Medicine | Admitting: Family Medicine

## 2015-07-13 DIAGNOSIS — J988 Other specified respiratory disorders: Secondary | ICD-10-CM

## 2015-07-13 DIAGNOSIS — J410 Simple chronic bronchitis: Secondary | ICD-10-CM | POA: Diagnosis not present

## 2015-07-13 DIAGNOSIS — Z720 Tobacco use: Secondary | ICD-10-CM

## 2015-07-13 DIAGNOSIS — J40 Bronchitis, not specified as acute or chronic: Principal | ICD-10-CM

## 2015-07-13 MED ORDER — METHYLPREDNISOLONE SODIUM SUCC 125 MG IJ SOLR
125.0000 mg | Freq: Once | INTRAMUSCULAR | Status: AC
  Administered 2015-07-13: 125 mg via INTRAMUSCULAR

## 2015-07-13 MED ORDER — LEVOFLOXACIN 500 MG PO TABS: 500.0000 mg | ORAL_TABLET | Freq: Every day | ORAL | Status: AC

## 2015-07-13 MED ORDER — FLUTICASONE-SALMETEROL 250-50 MCG/DOSE IN AEPB: 1.0000 | INHALATION_SPRAY | Freq: Two times a day (BID) | RESPIRATORY_TRACT | Status: AC

## 2015-07-13 MED ORDER — METHYLPREDNISOLONE SODIUM SUCC 125 MG IJ SOLR
INTRAMUSCULAR | Status: AC
  Filled 2015-07-13: qty 2

## 2015-07-13 MED ORDER — TIOTROPIUM BROMIDE MONOHYDRATE 18 MCG IN CAPS: 18.0000 ug | ORAL_CAPSULE | Freq: Every morning | RESPIRATORY_TRACT | Status: AC

## 2015-07-13 NOTE — ED Notes (Signed)
Pt  Reports  Symptoms  Of  Cough  /  Congested         With    Symptoms  X  5  Days       Pt  Reports  The  Cough  Is  Productive  He is  A    Smoker   As  Well

## 2015-07-13 NOTE — ED Provider Notes (Signed)
CSN: [id number]     Arrival date & time 07/13/15  1519 History   None    No chief complaint on file.  (Consider location/radiation/quality/duration/timing/severity/associated sxs/prior Treatment) Patient is a 60 y.o. male presenting with cough. The history is provided by the patient.  Cough Cough characteristics:  Productive and hacking Sputum characteristics:  Yellow and green Severity:  Moderate Onset quality:  Gradual Duration:  5 days Chronicity:  Chronic Smoker: yes   Context: smoke exposure   Relieved by:  None tried Worsened by:  Nothing tried Ineffective treatments:  None tried Associated symptoms: fever, rhinorrhea, shortness of breath and wheezing   Associated symptoms: no chills     Past Medical History  Diagnosis Date  . Coronary disease   . Myocardial infarction (HCC)     x2  . PAD (peripheral artery disease)    Past Surgical History  Procedure Laterality Date  . Coronary artery bypass graft  2000    By Dr. Laneta Simmers  . Rotator cuff repair    . Knee surgery      x3   Family History  Problem Relation Age of Onset  . Stroke Mother   . Hypertension Mother   . Stroke Father   . Heart failure Father    Social History  Substance Use Topics  . Smoking status: Current Every Day Smoker  . Smokeless tobacco: Not on file  . Alcohol Use: Yes    Review of Systems  Constitutional: Positive for fever. Negative for chills.  HENT: Positive for rhinorrhea.   Respiratory: Positive for cough, shortness of breath and wheezing.   Cardiovascular: Negative.   All other systems reviewed and are negative.   Allergies  Nitroglycerin  Home Medications   Prior to Admission medications   Medication Sig Start Date End Date Taking? Authorizing Provider  AMLODIPINE BESYLATE PO Take by mouth daily.    Historical Provider, MD  Dextromethorphan-Guaifenesin [facility] DM PO) Take by mouth as directed.    Historical Provider, MD  simvastatin (ZOCOR) 20 MG tablet Take 20 mg by  mouth every evening.    Historical Provider, MD   Meds Ordered and Administered this Visit  Medications - No data to display  BP 122/84 mmHg  Pulse 67  Temp(Src) 98.8 F (37.1 C) (Oral)  Resp 16  SpO2 95% No data found.   Physical Exam  Constitutional: He is oriented to person, place, and time. He appears well-developed and well-nourished. No distress.  HENT:  Right Ear: External ear normal.  Left Ear: External ear normal.  Mouth/Throat: Oropharynx is clear and moist.  Eyes: Pupils are equal, round, and reactive to light.  Neck: Normal range of motion. Neck supple.  Cardiovascular: Regular rhythm, normal heart sounds and intact distal pulses.   Pulmonary/Chest: Effort normal. He has decreased breath sounds. He has wheezes. He has rhonchi. He has rales. He exhibits no tenderness.  Abdominal: Soft.  Lymphadenopathy:    He has no cervical adenopathy.  Neurological: He is alert and oriented to person, place, and time.  Skin: Skin is warm and dry.  Nursing note and vitals reviewed.   ED Course  Procedures (including critical care time)  Labs Review Labs Reviewed - No data to display  Imaging Review No results found. X-rays reviewed and report per radiologist.   Visual Acuity Review  Right Eye Distance:   Left Eye Distance:   Bilateral Distance:    Right Eye Near:   Left Eye Near:    Bilateral Near:  MDM  No diagnosis found. Meds ordered this encounter  Medications  . methylPREDNISolone sodium succinate (SOLU-MEDROL) 125 mg/2 mL injection 125 mg    Sig:   . levofloxacin (LEVAQUIN) 500 MG tablet    Sig: Take 1 tablet (500 mg total) by mouth daily.    Dispense:  7 tablet    Refill:  0  . Fluticasone-Salmeterol (ADVAIR DISKUS) 250-50 MCG/DOSE AEPB    Sig: Inhale 1 puff into the lungs 2 (two) times daily.    Dispense:  60 each    Refill:  1  . tiotropium (SPIRIVA HANDIHALER) 18 MCG inhalation capsule    Sig: Place 1 capsule (18 mcg total) into  inhaler and inhale every morning.    Dispense:  30 capsule    Refill:  3       Linna Hoff, MD 07/13/15 1815

## 2015-07-14 DIAGNOSIS — J988 Other specified respiratory disorders: Secondary | ICD-10-CM

## 2015-07-14 NOTE — Congregational Nurse Program (Signed)
Congregational Nurse Program Note  Date of Encounter: 07/13/2015  Past Medical History: Past Medical History  Diagnosis Date  . Coronary disease   . Myocardial infarction (HCC)     x2  . PAD (peripheral artery disease) (HCC)     Encounter Details:     CNP Questionnaire - 07/13/15 1345    Patient Demographics   Is this a new or existing patient? Existing   Patient is considered a/an Not Applicable   Race Caucasian/White   Patient Assistance   Location of Patient Assistance Not Applicable   Patient's financial/insurance status Medicaid;Low Income   Uninsured Patient No   Patient referred to apply for the following financial assistance Not Applicable   Food insecurities addressed Provided food supplies   Transportation assistance Yes   Type of Assistance Bus Pass Given   Assistance securing medications No   Educational health offerings Acute disease   Encounter Details   Primary purpose of visit Acute Illness/Condition Visit;Education/Health Concerns   Was an Emergency Department visit averted? Yes   Does patient have a medical provider? No   Patient referred to Urgent Care   Was a mental health screening completed? (GAINS tool) No   Does patient have dental issues? No   Since previous encounter, have you referred patient for abnormal blood pressure that resulted in a new diagnosis or medication change? No   Since previous encounter, have you referred patient for abnormal blood glucose that resulted in a new diagnosis or medication change? No   For Abstraction Use Only   Does patient have insurance? Yes     client complains of productive cough, and generally not feeling well.  Has a hx of COPD.  Bilateral highpitched wheezing and rhonchi.  Patient sent to urgent care for follow up.  Bus  Pass'  provided

## 2015-07-14 NOTE — Congregational Nurse Program (Signed)
Congregational Nurse Program Note  Date of Encounter: 07/14/2015  Past Medical History: Past Medical History  Diagnosis Date  . Coronary disease   . Myocardial infarction (HCC)     x2  . PAD (peripheral artery disease) (HCC)     Encounter Details:     CNP Questionnaire - 07/14/15 1349    Patient Demographics   Is this a new or existing patient? Existing   Patient is considered a/an Not Applicable   Race Caucasian/White   Patient Assistance   Location of Patient Assistance Not Applicable   Patient's financial/insurance status Medicaid;Low Income   Uninsured Patient No   Patient referred to apply for the following financial assistance Not Applicable   Food insecurities addressed Provided food supplies   Transportation assistance Yes   Type of Assistance Bus Pass Given   Assistance securing medications Yes   Educational health offerings Acute disease   Encounter Details   Primary purpose of visit Acute Illness/Condition Visit;Education/Health Concerns;Navigating the Healthcare System   Was an Emergency Department visit averted? Yes   Does patient have a medical provider? No   Patient referred to Urgent Care   Was a mental health screening completed? (GAINS tool) No   Does patient have dental issues? No   Since previous encounter, have you referred patient for abnormal blood pressure that resulted in a new diagnosis or medication change? No   Since previous encounter, have you referred patient for abnormal blood glucose that resulted in a new diagnosis or medication change? No   For Abstraction Use Only   Does patient have insurance? Yes       Client was seen in Urgent Care last night.  Scripts received.  Assisted client will getting scripts filled and provided bus pass'.  States is starting to feel better as a result of the prednisone injection received last night.  Provided instructions to the shelter staff to allow client frequent rest periods and access to shelter during  when the temperatures are below 40.

## 2015-07-14 NOTE — Congregational Nurse Program (Signed)
Congregational Nurse Program Note  Date of Encounter: 07/06/2015  Past Medical History: Past Medical History  Diagnosis Date  . Coronary disease   . Myocardial infarction (HCC)     x2  . PAD (peripheral artery disease) (HCC)     Encounter Details:     CNP Questionnaire - 07/06/15 1340    Patient Demographics   Is this a new or existing patient? New   Patient is considered a/an Not Applicable   Race Caucasian/White   Patient Assistance   Location of Patient Assistance Not Applicable   Patient's financial/insurance status Medicaid;Low Income   Uninsured Patient No   Patient referred to apply for the following financial assistance Not Applicable   Food insecurities addressed Provided food supplies   Transportation assistance No   Assistance securing medications No   Educational health offerings Acute disease   Encounter Details   Primary purpose of visit Acute Illness/Condition Visit;Education/Health Concerns   Was an Emergency Department visit averted? Not Applicable   Does patient have a medical provider? No   Patient referred to Not Applicable   Was a mental health screening completed? (GAINS tool) No   Does patient have dental issues? No   Since previous encounter, have you referred patient for abnormal blood pressure that resulted in a new diagnosis or medication change? No   Since previous encounter, have you referred patient for abnormal blood glucose that resulted in a new diagnosis or medication change? No   For Abstraction Use Only   Does patient have insurance? Yes       Flu vaccine given

## 2015-07-18 ENCOUNTER — Emergency Department (EMERGENCY_DEPARTMENT_HOSPITAL): Payer: Medicare Other

## 2015-07-18 ENCOUNTER — Encounter (HOSPITAL_COMMUNITY): Payer: Self-pay | Admitting: *Deleted

## 2015-07-18 ENCOUNTER — Emergency Department (INDEPENDENT_AMBULATORY_CARE_PROVIDER_SITE_OTHER)
Admission: EM | Admit: 2015-07-18 | Discharge: 2015-07-18 | Disposition: A | Discharge status: Other Acute Inpatient Hospital | Payer: Medicare Other | Source: Home / Self Care | Attending: Family Medicine | Admitting: Family Medicine

## 2015-07-18 ENCOUNTER — Emergency Department (HOSPITAL_COMMUNITY)
Admission: EM | Admit: 2015-07-18 | Discharge: 2015-07-18 | Disposition: A | Discharge status: Home / Self Care | Payer: Medicare Other | Attending: Emergency Medicine | Admitting: Emergency Medicine

## 2015-07-18 DIAGNOSIS — Z79899 Other long term (current) drug therapy: Secondary | ICD-10-CM | POA: Diagnosis not present

## 2015-07-18 DIAGNOSIS — I2510 Atherosclerotic heart disease of native coronary artery without angina pectoris: Secondary | ICD-10-CM | POA: Diagnosis not present

## 2015-07-18 DIAGNOSIS — M79604 Pain in right leg: Secondary | ICD-10-CM | POA: Diagnosis not present

## 2015-07-18 DIAGNOSIS — Z139 Encounter for screening, unspecified: Secondary | ICD-10-CM

## 2015-07-18 DIAGNOSIS — Z7951 Long term (current) use of inhaled steroids: Secondary | ICD-10-CM | POA: Diagnosis not present

## 2015-07-18 DIAGNOSIS — L03115 Cellulitis of right lower limb: Secondary | ICD-10-CM | POA: Diagnosis not present

## 2015-07-18 DIAGNOSIS — F17200 Nicotine dependence, unspecified, uncomplicated: Secondary | ICD-10-CM | POA: Diagnosis not present

## 2015-07-18 DIAGNOSIS — M7989 Other specified soft tissue disorders: Secondary | ICD-10-CM

## 2015-07-18 DIAGNOSIS — Z951 Presence of aortocoronary bypass graft: Secondary | ICD-10-CM | POA: Insufficient documentation

## 2015-07-18 DIAGNOSIS — R2241 Localized swelling, mass and lump, right lower limb: Secondary | ICD-10-CM | POA: Diagnosis present

## 2015-07-18 DIAGNOSIS — M79609 Pain in unspecified limb: Secondary | ICD-10-CM

## 2015-07-18 DIAGNOSIS — I252 Old myocardial infarction: Secondary | ICD-10-CM | POA: Diagnosis not present

## 2015-07-18 DIAGNOSIS — M79601 Pain in right arm: Secondary | ICD-10-CM | POA: Diagnosis not present

## 2015-07-18 MED ORDER — SULFAMETHOXAZOLE-TRIMETHOPRIM 800-160 MG PO TABS: 1.0000 | ORAL_TABLET | Freq: Two times a day (BID) | ORAL | Status: AC

## 2015-07-18 NOTE — ED Provider Notes (Signed)
CSN: [id number]     Arrival date & time 07/18/15  1629 History  By signing my name below, I, Andre Pena, attest that this documentation has been prepared under the direction and in the presence of Riata Ikeda Camprubi-Soms, PA-C. Electronically Signed: [name], ED Scribe. 07/18/2015. 7:28 PM.    Chief Complaint  Patient presents with  . DVT   Patient is a 60 y.o. male presenting with leg pain. The history is provided by the patient. No language interpreter was used.  Leg Pain Location:  Leg Time since incident:  3 days Injury: no   Leg location:  R lower leg Pain details:    Quality:  Dull   Radiates to:  Does not radiate   Severity:  Mild   Onset quality:  Gradual   Duration:  3 days   Timing:  Constant   Progression:  Unchanged Chronicity:  New Prior injury to area:  No Relieved by:  Nothing Worsened by:  Bearing weight and activity Ineffective treatments:  Elevation Associated symptoms: swelling   Associated symptoms: no decreased ROM, no fever, no muscle weakness, no numbness and no tingling    HPI Comments: Andre Pena is a 60 y.o. male smoker with a PMHx of MI, PAD, and coronary artery disease, who presents to the Emergency Department complaining of a possible DVT to the right lower leg, with RLE pain onset 3 days ago. He currently describes the pain as a 5/10 constant dull nonradiating in the R mid-calf to the ankle, worse with walking and ankle movements, and unrelieved by elevation of the leg. He reports associated swelling, warmth, and mild erythema to the leg. He went to the urgent care who sent him here for a DVT study which was negative. Denies any injury, insect bite, IV drug use. He also denies any recent travel, immobilization, or surgeries. No prior hx of DVT. Pt denies fever, chills, CP, SOB, abdominal pain, n/v/d/c, urinary symptoms, numbness, tingling, weakness.  Pt is allergic to morphine and NTG. He is homeless.   Past Medical History  Diagnosis  Date  . Coronary disease   . Myocardial infarction (HCC)     x2  . PAD (peripheral artery disease) [facility])    Past Surgical History  Procedure Laterality Date  . Coronary artery bypass graft  2000    By Dr. Laneta Simmers  . Rotator cuff repair    . Knee surgery      x3   Family History  Problem Relation Age of Onset  . Stroke Mother   . Hypertension Mother   . Stroke Father   . Heart failure Father    Social History  Substance Use Topics  . Smoking status: Current Every Day Smoker  . Smokeless tobacco: None  . Alcohol Use: Yes    Review of Systems  Constitutional: Negative for fever and chills.  Respiratory: Negative for shortness of breath.   Cardiovascular: Positive for leg swelling. Negative for chest pain.  Gastrointestinal: Negative for nausea, vomiting, abdominal pain, diarrhea and constipation.  Genitourinary: Negative for dysuria, hematuria and difficulty urinating.  Musculoskeletal: Positive for arthralgias (RLE).  Skin: Positive for color change. Negative for wound.  Allergic/Immunologic: Negative for immunocompromised state.  Neurological: Negative for weakness and numbness.  Psychiatric/Behavioral: Negative for confusion.   10 Systems reviewed and all are negative for acute change except as noted in the HPI.  Allergies  Morphine and related and Nitroglycerin  Home Medications   Prior to Admission medications   Medication Sig  Start Date End Date Taking? Authorizing Provider  Fluticasone-Salmeterol (ADVAIR DISKUS) 250-50 MCG/DOSE AEPB Inhale 1 puff into the lungs 2 (two) times daily. 07/13/15  Yes Linna Hoff, MD  levofloxacin (LEVAQUIN) 500 MG tablet Take 1 tablet (500 mg total) by mouth daily. 07/13/15  Yes Linna Hoff, MD  simvastatin (ZOCOR) 20 MG tablet Take 20 mg by mouth every evening.   Yes Historical Provider, MD  tiotropium (SPIRIVA HANDIHALER) 18 MCG inhalation capsule Place 1 capsule (18 mcg total) into inhaler and inhale every morning. 07/13/15  Yes  Linna Hoff, MD   BP 126/91 mmHg  Pulse 70  Temp(Src) 98.8 F (37.1 C) (Oral)  Resp 20  SpO2 97% Physical Exam  Constitutional: He is oriented to person, place, and time. Vital signs are normal. He appears well-developed and well-nourished.  Non-toxic appearance. No distress.  Afebrile, nontoxic, NAD  HENT:  Head: Normocephalic and atraumatic.  Mouth/Throat: Mucous membranes are normal.  Eyes: Conjunctivae and EOM are normal. Right eye exhibits no discharge. Left eye exhibits no discharge.  Neck: Normal range of motion. Neck supple.  Cardiovascular: Normal rate and intact distal pulses.   Pulmonary/Chest: Effort normal. No respiratory distress.  Abdominal: Normal appearance. He exhibits no distension.  Musculoskeletal: Normal range of motion.       Right lower leg: He exhibits tenderness, swelling and edema. He exhibits no bony tenderness and no laceration.  R ower leg with mild TTP diffusely from mid-calf down towards the ankle, mild erythema anteriorly with minimal warmth, no skin abrasions or injury, with 1+ pedal edema. FROM intact in all joints, strength and sensation grossly intact, distal pulses intact. Soft compartments  Neurological: He is alert and oriented to person, place, and time. He has normal strength. No sensory deficit.  Skin: Skin is warm, dry and intact. No rash noted.  Psychiatric: He has a normal mood and affect.  Nursing note and vitals reviewed.   ED Course  Procedures  DIAGNOSTIC STUDIES: Oxygen Saturation is 97% on RA, adequate by my interpretation.    COORDINATION OF CARE: 7:16 PM - Discussed plans to treat for cellulitis. Will refer to a PCP. Pt advised of plan for treatment and pt agrees.  Labs Review Labs Reviewed - No data to display  Imaging Review No results found.   Progress Notes by Kerrin Champagne at 07/18/2015 6:54 PM    Author: Kerrin Champagne Service: Vascular Lab Author Type: Cardiovascular Sonographer   Filed:  07/18/2015 6:55 PM Note Time: 07/18/2015 6:54 PM Status: Signed   Editor: Nolberto Hanlon Slaughter (Cardiovascular Sonographer)     Expand All Collapse All   VASCULAR LAB PRELIMINARY PRELIMINARY PRELIMINARY PRELIMINARY  Right lower extremity venous duplex completed.   Preliminary report: Right: No evidence of DVT, superficial thrombosis, or Baker's cyst.  SLAUGHTER, [place], RVS 07/18/2015, 6:54 PM       I have personally reviewed and evaluated these images and lab results as part of my medical decision-making.   EKG Interpretation None      MDM   Final diagnoses:  Cellulitis of right leg  Right leg swelling    60 y.o. male here with R lower leg swelling and erythema/warmth. Extremity NVI with soft compartments, 1+ pedal edema and some tenderness on exam. No known injury. DVT study was done and was neg. No skin injury but exam consistent with possible cellulitis. Will treat as such, will start on bactrim. Pt declines wanting pain meds. Discussed elevation, tylenol/motrin, and f/up with his  [facility] provider in 1-2 days for recheck. Case manager came to ensure he had good f/up, which he does. I explained the diagnosis and have given explicit precautions to return to the ER including for any other new or worsening symptoms. The patient understands and accepts the medical plan as it's been dictated and I have answered their questions. Discharge instructions concerning home care and prescriptions have been given. The patient is STABLE and is discharged to home in good condition.   I personally performed the services described in this documentation, which was scribed in my presence. The recorded information has been reviewed and is accurate.  BP 126/91 mmHg  Pulse 70  Temp(Src) 98.8 F (37.1 C) (Oral)  Resp 20  SpO2 97%  Meds ordered this encounter  Medications  . sulfamethoxazole-trimethoprim (BACTRIM DS,SEPTRA DS) 800-160 MG tablet    Sig: Take 1 tablet by mouth 2 (two) times  daily.    Dispense:  14 tablet    Refill:  0    Order Specific Question:  Supervising Provider    Answer:  Eber Hong [[id number]]     Bernetha Anschutz Camprubi-Soms, PA-C 07/18/15 1951  Gerhard Munch, MD 07/19/15 2333

## 2015-07-18 NOTE — ED Notes (Signed)
Pt stable, ambulatory, states understanding of discharge instructions 

## 2015-07-18 NOTE — ED Provider Notes (Addendum)
CSN: [id number]     Arrival date & time 07/18/15  1419 History   First MD Initiated Contact with Patient 07/18/15 1548     Chief Complaint  Patient presents with  . Leg Swelling   (Consider location/radiation/quality/duration/timing/severity/associated sxs/prior Treatment) Patient is a 60 y.o. male presenting with leg pain. The history is provided by the patient.  Leg Pain Location:  Leg Time since incident:  3 days Injury: no   Leg location:  R lower leg Pain details:    Quality:  Aching and cramping   Radiates to:  Does not radiate   Severity:  Moderate   Onset quality:  Gradual   Progression:  Worsening Chronicity:  New Dislocation: no   Prior injury to area:  Yes (s/p graft site for cabg) Worsened by:  Activity and bearing weight Associated symptoms: swelling   Associated symptoms: no back pain, no decreased ROM, no fever and no numbness     Past Medical History  Diagnosis Date  . Coronary disease   . Myocardial infarction (HCC)     x2  . PAD (peripheral artery disease) [facility])    Past Surgical History  Procedure Laterality Date  . Coronary artery bypass graft  2000    By Dr. Laneta Simmers  . Rotator cuff repair    . Knee surgery      x3   Family History  Problem Relation Age of Onset  . Stroke Mother   . Hypertension Mother   . Stroke Father   . Heart failure Father    Social History  Substance Use Topics  . Smoking status: Current Every Day Smoker  . Smokeless tobacco: None  . Alcohol Use: Yes    Review of Systems  Constitutional: Negative.  Negative for fever.  Musculoskeletal: Positive for joint swelling and gait problem. Negative for back pain.  Skin: Negative.   All other systems reviewed and are negative.   Allergies  Nitroglycerin  Home Medications   Prior to Admission medications   Medication Sig Start Date End Date Taking? Authorizing Provider  AMLODIPINE BESYLATE PO Take by mouth daily.    Historical Provider, MD    Dextromethorphan-Guaifenesin [facility] DM PO) Take by mouth as directed.    Historical Provider, MD  Fluticasone-Salmeterol (ADVAIR DISKUS) 250-50 MCG/DOSE AEPB Inhale 1 puff into the lungs 2 (two) times daily. 07/13/15   Linna Hoff, MD  levofloxacin (LEVAQUIN) 500 MG tablet Take 1 tablet (500 mg total) by mouth daily. 07/13/15   Linna Hoff, MD  simvastatin (ZOCOR) 20 MG tablet Take 20 mg by mouth every evening.    Historical Provider, MD  tiotropium (SPIRIVA HANDIHALER) 18 MCG inhalation capsule Place 1 capsule (18 mcg total) into inhaler and inhale every morning. 07/13/15   Linna Hoff, MD   Meds Ordered and Administered this Visit  Medications - No data to display  BP 129/87 mmHg  Pulse 65  Temp(Src) 97.7 F (36.5 C) (Oral)  Resp 16  SpO2 98% No data found.   Physical Exam  Constitutional: He is oriented to person, place, and time. He appears well-developed and well-nourished.  Musculoskeletal: He exhibits edema and tenderness.       Right lower leg: He exhibits tenderness, swelling and edema.       Legs: Neurological: He is alert and oriented to person, place, and time.  Skin: Skin is warm and dry.  Nursing note and vitals reviewed.   ED Course  Procedures (including critical care time)  Labs Review  Labs Reviewed - No data to display  Imaging Review No results found.   Visual Acuity Review  Right Eye Distance:   Left Eye Distance:   Bilateral Distance:    Right Eye Near:   Left Eye Near:    Bilateral Near:         MDM   1. Leg pain, anterior, right    Sent for right leg swelling for 3d getting worse,NKI, r/o DVT.           Linna Hoff, MD 07/18/15 1606  Linna Hoff, MD 07/18/15 [id number]

## 2015-07-18 NOTE — Congregational Nurse Program (Signed)
Congregational Nurse Program Note  Date of Encounter: 07/18/2015  Past Medical History: Past Medical History  Diagnosis Date  . Coronary disease   . Myocardial infarction (HCC)     x2  . PAD (peripheral artery disease) (HCC)     Encounter Details:     CNP Questionnaire - 07/18/15 1421    Patient Demographics   Is this a new or existing patient? Existing   Patient is considered a/an Not Applicable   Race Caucasian/White   Patient Assistance   Location of Patient Assistance Not Applicable   Patient's financial/insurance status Medicaid;Low Income   Uninsured Patient No   Patient referred to apply for the following financial assistance Not Applicable   Food insecurities addressed Provided food supplies   Transportation assistance Yes   Type of Assistance Bus Pass Given   Assistance securing medications No   Educational health offerings Acute disease   Encounter Details   Primary purpose of visit Acute Illness/Condition Visit;Education/Health Concerns;Navigating the Healthcare System   Was an Emergency Department visit averted? Yes   Does patient have a medical provider? No   Patient referred to Urgent Care   Was a mental health screening completed? (GAINS tool) No   Does patient have dental issues? No   Since previous encounter, have you referred patient for abnormal blood pressure that resulted in a new diagnosis or medication change? No   Since previous encounter, have you referred patient for abnormal blood glucose that resulted in a new diagnosis or medication change? No   For Abstraction Use Only   Does patient have insurance? Yes       Clinic visit.  States is experiencing pain and swelling in Right Leg.  Right Leg from calf to ankle reddened and swollen.  States pain level is 7/10.  No redness or swelling noted in left leg.  States has a hx of PAD.  States respiratory symptoms much improved and is feeling better.  Referred client to urgent care to have leg evaluated  given vascular hx.  Instructed client to return to clinic on Thursday.  Will assist him in obtaining a primary care provider.  Client also reports has decreased his smoking.  Has gone from 11/2 packs a day to 1/2 pack a day.  His goal is to stop smoking.

## 2015-07-18 NOTE — Progress Notes (Signed)
VASCULAR LAB PRELIMINARY  PRELIMINARY  PRELIMINARY  PRELIMINARY  Right lower extremity venous duplex completed.    Preliminary report:  Right:  No evidence of DVT, superficial thrombosis, or Baker's cyst.  Rimas Gilham, RVS 07/18/2015, 6:54 PM

## 2015-07-18 NOTE — ED Notes (Signed)
Pt c/o right leg swelling x 3 days. Redness also noted to right leg where pt is c/o pain. Pt states that if he flexes his foot up it hurts worse.

## 2015-07-18 NOTE — ED Notes (Signed)
Pt  Reports     r  Leg     Swelling       X 3  Days     Pt  denys  Any  specefic  Injury       Slight      Redness  Noted       Pt   denys   Any  Recent  Air travel           No chest  Pain  Or  Shortness  Of  Breath

## 2015-07-18 NOTE — Discharge Instructions (Signed)
Keep leg clean and dry. Apply warm compresses to affected area throughout the day, alternate between ice and heat for 20 minutes every hour. Elevate your leg to help with swelling and pain. Take antibiotic until it is finished. Take tylenol and motrin as needed for pain. Followup with [facility]/Primary Care doctor in 2 days for wound recheck. Monitor area for signs of infection to include, but not limited to: increasing pain, spreading redness, drainage/pus, worsening swelling, or fevers. Return to emergency department for emergent changing or worsening symptoms.    Cellulitis Cellulitis is an infection of the skin and the tissue under the skin. The infected area is usually red and tender. This happens most often in the arms and lower legs. HOME CARE   Take your antibiotic medicine as told. Finish the medicine even if you start to feel better.  Keep the infected arm or leg raised (elevated).  Put a warm cloth on the area up to 4 times per day.  Only take medicines as told by your doctor.  Keep all doctor visits as told. GET HELP IF:  You see red streaks on the skin coming from the infected area.  Your red area gets bigger or turns a dark color.  Your bone or joint under the infected area is painful after the skin heals.  Your infection comes back in the same area or different area.  You have a puffy (swollen) bump in the infected area.  You have new symptoms.  You have a fever. GET HELP RIGHT AWAY IF:   You feel very sleepy.  You throw up (vomit) or have watery poop (diarrhea).  You feel sick and have muscle aches and pains.   This information is not intended to replace advice given to you by your health care provider. Make sure you discuss any questions you have with your health care provider.   Document Released: 11/20/2007 Document Revised: 02/22/2015 Document Reviewed: 08/19/2011 Elsevier Interactive Patient Education 2016 [employer]  Edema Edema is  an abnormal buildup of fluids. It is more common in your legs and thighs. Painless swelling of the feet and ankles is more likely as a person ages. It also is common in looser skin, like around your eyes. HOME CARE   Keep the affected body part above the level of the heart while lying down.  Do not sit still or stand for a long time.  Do not put anything right under your knees when you lie down.  Do not wear tight clothes on your upper legs.  Exercise your legs to help the puffiness (swelling) go down.  Wear elastic bandages or support stockings as told by your doctor.  A low-salt diet may help lessen the puffiness.  Only take medicine as told by your doctor. GET HELP IF:  Treatment is not working.  You have heart, liver, or kidney disease and notice that your skin looks puffy or shiny.  You have puffiness in your legs that does not get better when you raise your legs.  You have sudden weight gain for no reason. GET HELP RIGHT AWAY IF:   You have shortness of breath or chest pain.  You cannot breathe when you lie down.  You have pain, redness, or warmth in the areas that are puffy.  You have heart, liver, or kidney disease and get edema all of a sudden.  You have a fever and your symptoms get worse all of a sudden. MAKE SURE YOU:   Understand  these instructions.  Will watch your condition.  Will get help right away if you are not doing well or get worse.   This information is not intended to replace advice given to you by your health care provider. Make sure you discuss any questions you have with your health care provider.   Document Released: 11/20/2007 Document Revised: 06/08/2013 Document Reviewed: 03/26/2013 Elsevier Interactive Patient Education [employer]

## 2015-07-18 NOTE — Care Management (Signed)
ED CM met with patient at bedside regarding follow up. Patient is homeless and is active with the [facility]. Patient has been seen by Marliss Coots NP at the [facility]. CM notified [facility] NP patient will follow up post discharge tomorrow, patient is agreeable. Patient denies any difficulty obtaining medications patient has Medicare/ Medicaid.No further ED CM needs identified

## 2015-07-25 DIAGNOSIS — Z5900 Homelessness unspecified: Secondary | ICD-10-CM

## 2015-08-02 ENCOUNTER — Inpatient Hospital Stay: Payer: Medicare Other | Admitting: Critical Care Medicine

## 2015-08-14 NOTE — Congregational Nurse Program (Signed)
Congregational Nurse Program Note  Date of Encounter: 07/25/2015  Past Medical History: Past Medical History  Diagnosis Date  . Coronary disease   . Myocardial infarction (HCC)     x2  . PAD (peripheral artery disease) [facility])     Encounter Details:     CNP Questionnaire - 07/25/15 1959    Patient Demographics   Is this a new or existing patient? Existing   Patient is considered a/an Not Applicable   Race Caucasian/White   Patient Assistance   Location of Patient Assistance Not Applicable   Patient's financial/insurance status Medicaid;Low Income   Uninsured Patient No   Patient referred to apply for the following financial assistance Not Applicable   Food insecurities addressed Provided food supplies   Transportation assistance No   Assistance securing medications No   Educational health offerings Acute disease;Navigating the healthcare system;Chronic disease   Encounter Details   Primary purpose of visit Acute Illness/Condition Visit;Education/Health Concerns;Navigating the Healthcare System;Post ED/Hospitalization Visit   Was an Emergency Department visit averted? Yes   Does patient have a medical provider? No   Patient referred to Follow up with established PCP   Was a mental health screening completed? (GAINS tool) No   Does patient have dental issues? No   Does patient have vision issues? No   Since previous encounter, have you referred patient for abnormal blood pressure that resulted in a new diagnosis or medication change? No   Since previous encounter, have you referred patient for abnormal blood glucose that resulted in a new diagnosis or medication change? No   For Abstraction Use Only   Does patient have insurance? Yes     States feeling much better.  Right leg has less swelling and no redness.  Encouraged to keep let elevated as much as possible
